# Patient Record
Sex: Female | Born: 1980 | Race: White | Hispanic: No
Health system: Southern US, Academic
[De-identification: ages and names within clinical notes are randomized; demographics above are authoritative.]

## PROBLEM LIST (undated history)

## (undated) ENCOUNTER — Encounter

## (undated) ENCOUNTER — Ambulatory Visit

## (undated) ENCOUNTER — Encounter: Attending: Infectious Disease | Primary: Infectious Disease

## (undated) ENCOUNTER — Encounter
Attending: Student in an Organized Health Care Education/Training Program | Primary: Student in an Organized Health Care Education/Training Program

## (undated) ENCOUNTER — Telehealth

## (undated) ENCOUNTER — Ambulatory Visit
Payer: BLUE CROSS/BLUE SHIELD | Attending: Student in an Organized Health Care Education/Training Program | Primary: Student in an Organized Health Care Education/Training Program

## (undated) ENCOUNTER — Ambulatory Visit: Payer: BLUE CROSS/BLUE SHIELD

## (undated) ENCOUNTER — Ambulatory Visit: Payer: PRIVATE HEALTH INSURANCE

## (undated) ENCOUNTER — Ambulatory Visit
Payer: PRIVATE HEALTH INSURANCE | Attending: Student in an Organized Health Care Education/Training Program | Primary: Student in an Organized Health Care Education/Training Program

## (undated) DIAGNOSIS — L409 Psoriasis, unspecified: Secondary | ICD-10-CM

## (undated) HISTORY — DX: Psoriasis, unspecified: L40.9

---

## 2014-04-19 ENCOUNTER — Other Ambulatory Visit: Payer: Self-pay | Admitting: Otolaryngology

## 2014-04-19 DIAGNOSIS — G43009 Migraine without aura, not intractable, without status migrainosus: Secondary | ICD-10-CM

## 2014-04-29 ENCOUNTER — Ambulatory Visit
Admission: RE | Admit: 2014-04-29 | Discharge: 2014-04-29 | Disposition: A | Discharge status: Home / Self Care | Payer: Medicaid Other | Source: Ambulatory Visit | Attending: Otolaryngology | Admitting: Otolaryngology

## 2014-04-29 DIAGNOSIS — G43009 Migraine without aura, not intractable, without status migrainosus: Secondary | ICD-10-CM

## 2014-04-29 MED ORDER — GADOBENATE DIMEGLUMINE 529 MG/ML IV SOLN
12.0000 mL | Freq: Once | INTRAVENOUS | Status: AC | PRN
  Administered 2014-04-29: 12 mL via INTRAVENOUS

## 2016-06-02 IMAGING — MR MR HEAD WO/W CM
10 of 11 series · 30 of 48 positions shown · IV contrast (multihance)
Comparison: None.

CLINICAL DATA: Acute onset of left-sided pressure and pain in the
left ear region.

EXAM:
MRI HEAD WITHOUT AND WITH CONTRAST
TECHNIQUE: Multiplanar, multiecho pulse sequences of the brain and surrounding
structures were obtained without and with intravenous contrast.
CONTRAST:  12mL MULTIHANCE GADOBENATE DIMEGLUMINE 529 MG/ML IV SOLN

[Series 2: T1 · sagittal · 5.0mm · 0.45mm/px · 2 of 21 slices shown (1 of 4)]
[im 1/21]
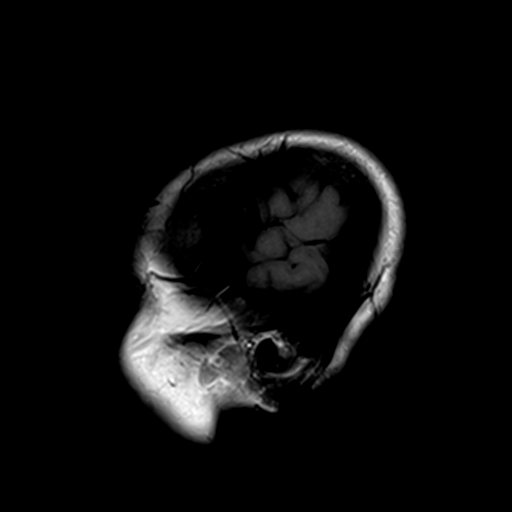
[im 21/21]
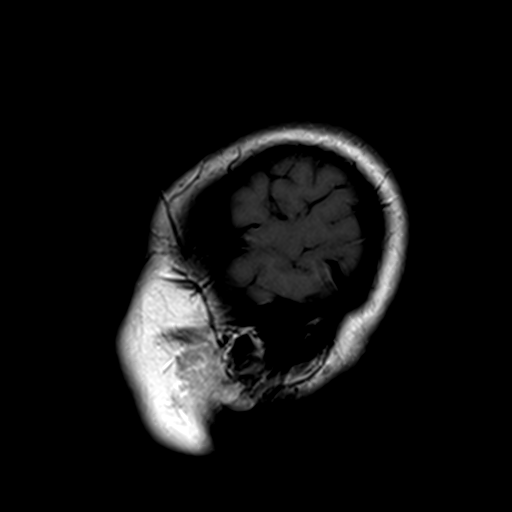

[Series 3: ep2d_diff_(id)_trace · axial · 5.0mm · 1.80mm/px · z∈[-35,+103]mm · 7 of 46 slices shown]
[im 1/46]
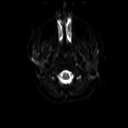
[im 8/46]
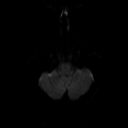
[im 16/46]
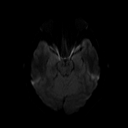
[im 23/46]
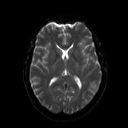
[im 31/46]
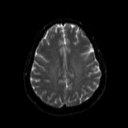
[im 38/46]
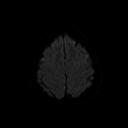
[im 46/46]
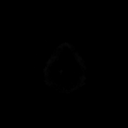

[Series 4: ep2d_diff_(id)_trace_adc · axial · 5.0mm · 1.80mm/px · z∈[-35,+103]mm · 4 of 23 slices shown]
[im 1/23]
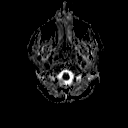
[im 8/23]
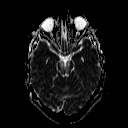
[im 15/23]
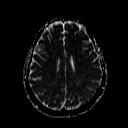
[im 23/23]
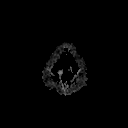

[Series 5: T2 · axial · 5.0mm · 0.45mm/px · z∈[-31,+100]mm · 4 of 22 slices shown]
[im 1/22]
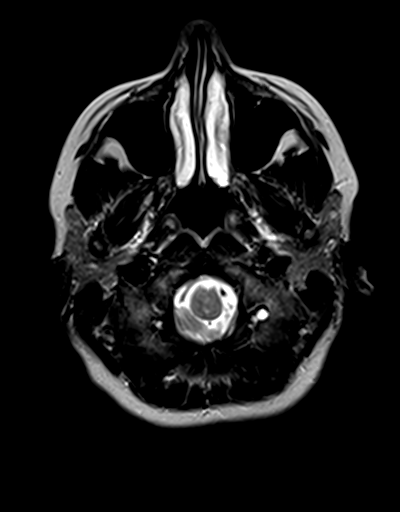
[im 8/22]
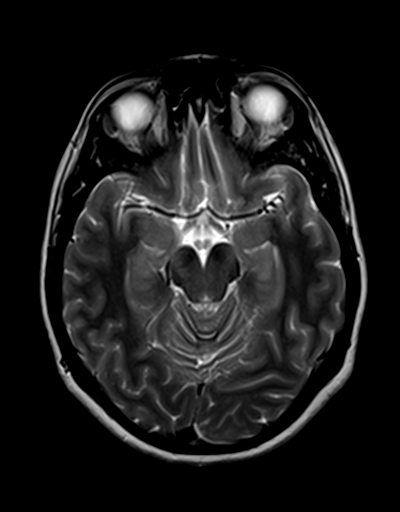
[im 15/22]
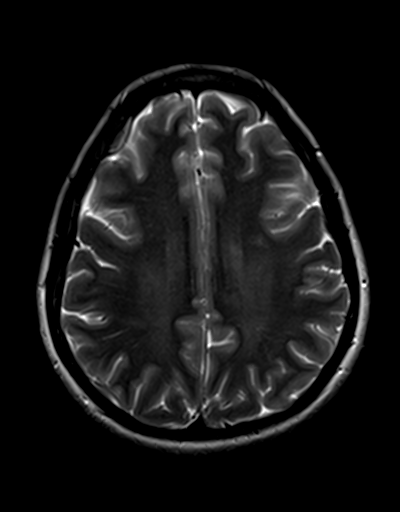
[im 22/22]
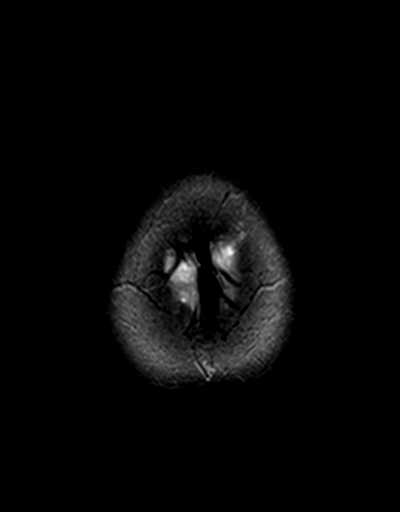

[Series 6: FLAIR · axial · 5.0mm · 0.45mm/px · z∈[-32,+99]mm · 4 of 22 slices shown]
[im 1/22]
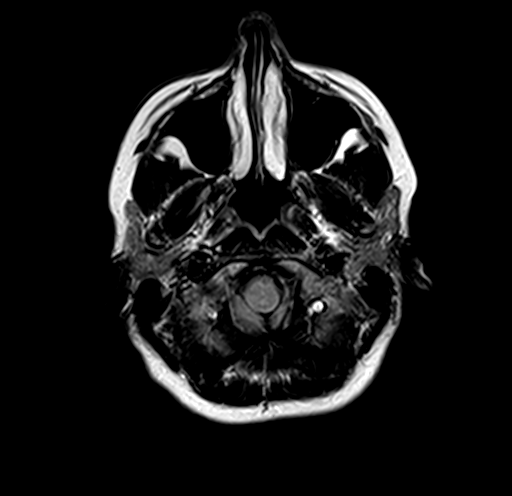
[im 8/22]
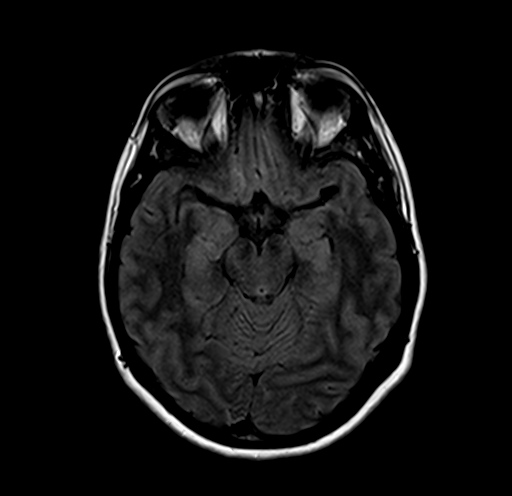
[im 15/22]
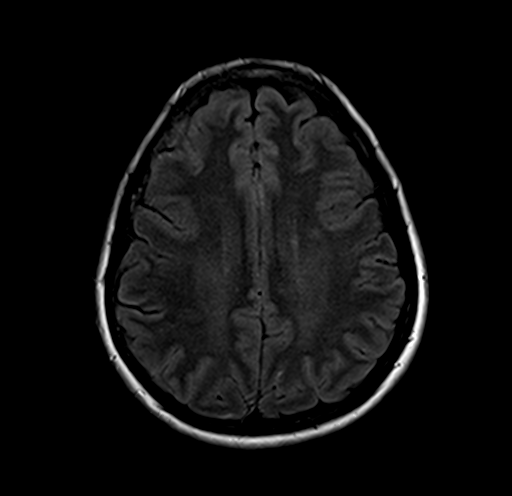
[im 22/22]
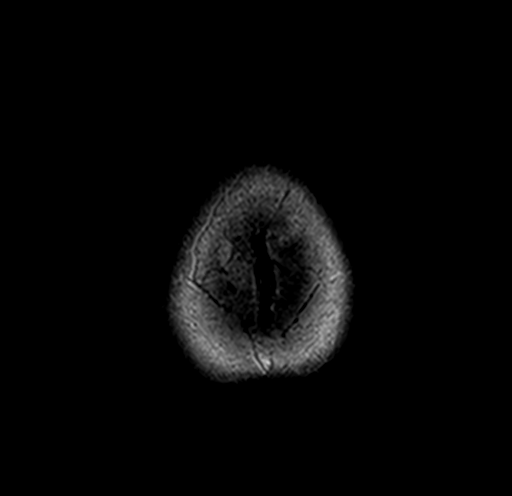

[Series 7: T1 · coronal · 3.0mm · 0.35mm/px · 2 of 11 slices shown (2 of 4)]
[im 1/11]
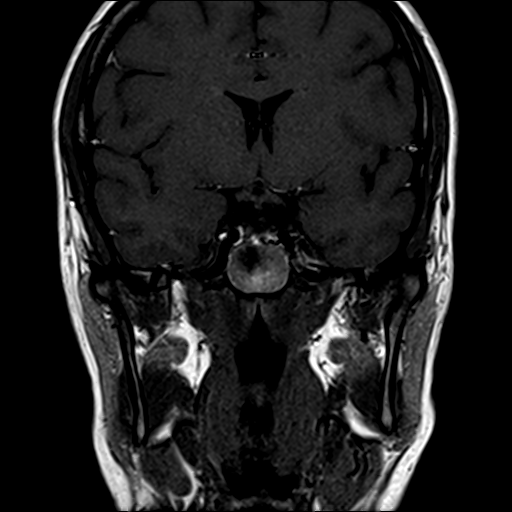
[im 11/11]
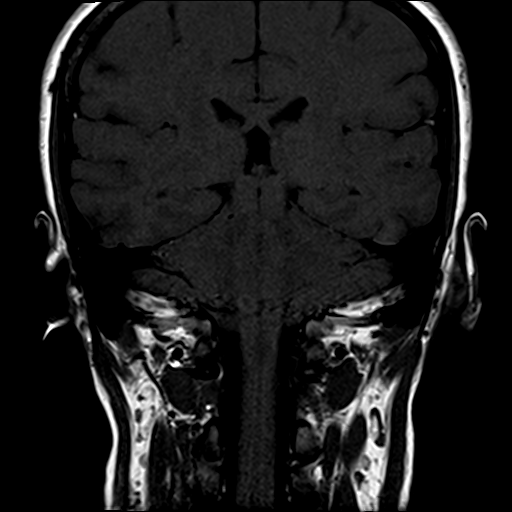

[Series 8: T1 · axial · 3.0mm · 0.35mm/px · z∈[-35,-4]mm · 2 of 11 slices shown (3 of 4)]
[im 1/11]
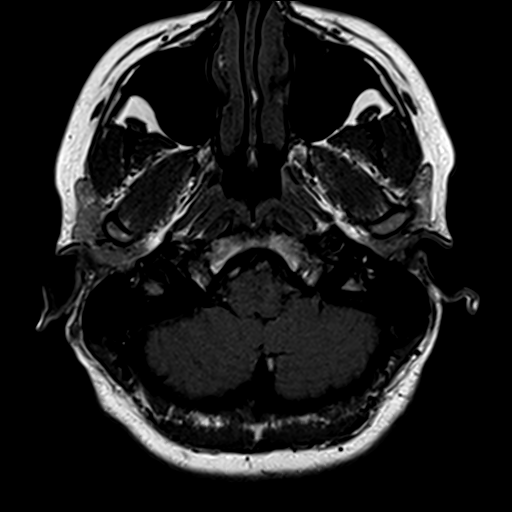
[im 11/11]
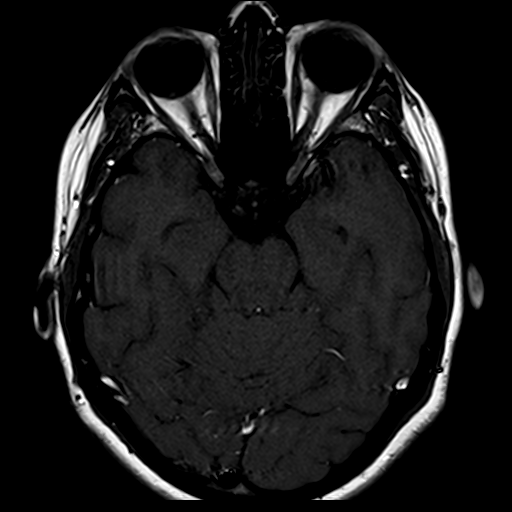

[Series 9: bSSFP · axial · 0.7mm · 0.28mm/px · 1 of 44 slices shown]
[im 1/44]
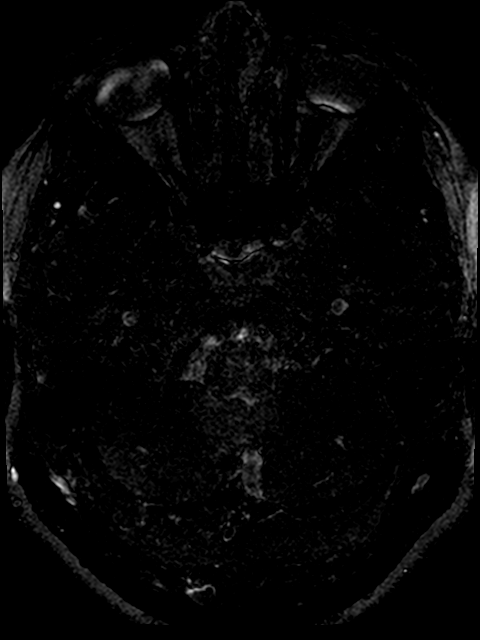

[Series 11: T1 · coronal · 3.0mm · 0.35mm/px · 2 of 11 slices shown (4 of 4)]
[im 1/11]
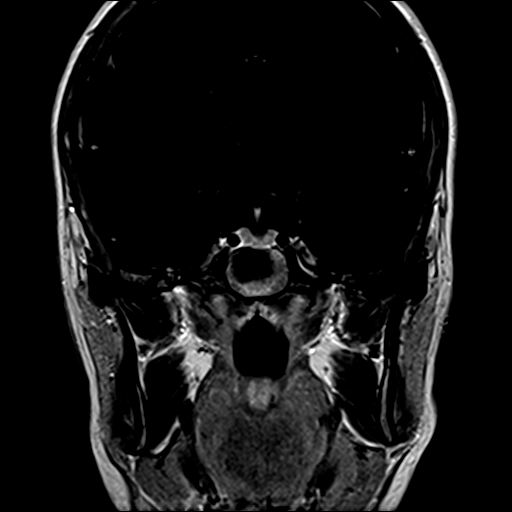
[im 11/11]
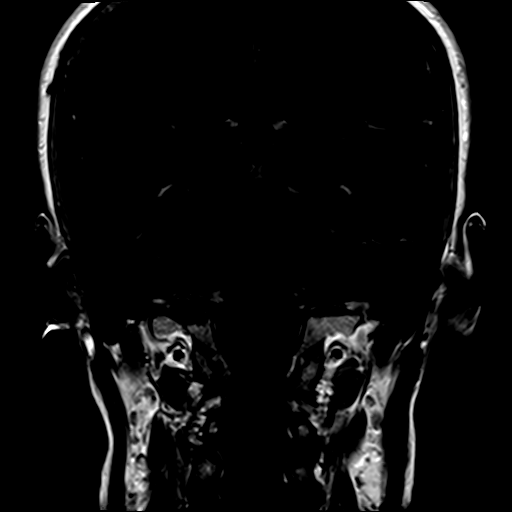

[Series 12: T1 post-contrast · axial · 3.0mm · 0.35mm/px · z∈[-35,-4]mm · 2 of 11 slices shown]
[im 1/11]
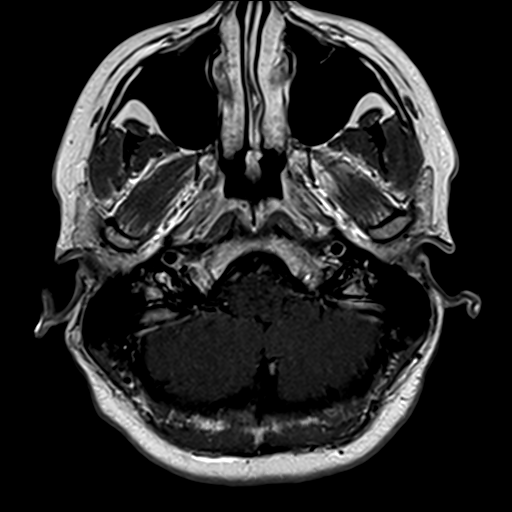
[im 11/11]
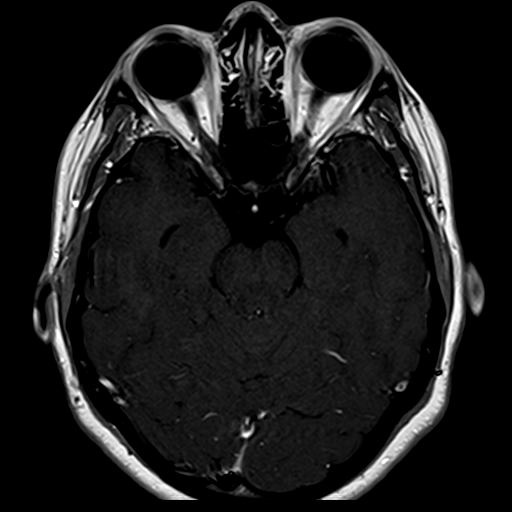

[30 of 48 positions shown; findings below may reference images not displayed]

FINDINGS: Diffusion imaging does not show any acute or subacute infarction.
The brainstem and cerebellum are normal. CP angle regions are
normal. No seventh or eighth nerve lesion. No vestibular schwannoma.
No fluid in the sinuses, middle ears or mastoids.

The cerebral hemispheres are normal except for a single focus of T2
and FLAIR signal in the white matter adjacent to the atrium of the
left lateral ventricle. As an isolated finding, this is not likely
of any significance.

No mass lesion, hemorrhage, hydrocephalus or extra-axial collection.
After contrast administration, no abnormal enhancement occurs.
IMPRESSION: No cause of the presenting symptoms is identified. No evidence of
vestibular schwannoma or any other pathology in the region of the
left temporal bone.

Single tiny focus of white matter signal adjacent to the atrium of
the left lateral ventricle, probably not of any clinical
significance.

## 2018-05-10 ENCOUNTER — Encounter: Payer: Self-pay | Admitting: Allergy and Immunology

## 2018-05-10 ENCOUNTER — Ambulatory Visit: Payer: BLUE CROSS/BLUE SHIELD | Admitting: Allergy and Immunology

## 2018-05-10 VITALS — BP 100/70 | HR 85 | Resp 16 | Ht 63.0 in | Wt 152.0 lb

## 2018-05-10 DIAGNOSIS — L2089 Other atopic dermatitis: Secondary | ICD-10-CM | POA: Diagnosis not present

## 2018-05-10 DIAGNOSIS — L209 Atopic dermatitis, unspecified: Secondary | ICD-10-CM | POA: Insufficient documentation

## 2018-05-10 DIAGNOSIS — T7840XD Allergy, unspecified, subsequent encounter: Secondary | ICD-10-CM

## 2018-05-10 DIAGNOSIS — J3089 Other allergic rhinitis: Secondary | ICD-10-CM | POA: Diagnosis not present

## 2018-05-10 DIAGNOSIS — L500 Allergic urticaria: Secondary | ICD-10-CM | POA: Diagnosis not present

## 2018-05-10 DIAGNOSIS — T7840XA Allergy, unspecified, initial encounter: Secondary | ICD-10-CM | POA: Insufficient documentation

## 2018-05-10 DIAGNOSIS — K2970 Gastritis, unspecified, without bleeding: Secondary | ICD-10-CM

## 2018-05-10 MED ORDER — MONTELUKAST SODIUM 10 MG PO TABS: 10.0000 mg | ORAL_TABLET | Freq: Every day | ORAL | 5 refills | Status: AC

## 2018-05-10 MED ORDER — LEVOCETIRIZINE DIHYDROCHLORIDE 5 MG PO TABS: 5.0000 mg | ORAL_TABLET | Freq: Every evening | ORAL | 5 refills | Status: AC

## 2018-05-10 MED ORDER — FAMOTIDINE 20 MG PO TABS: 20.0000 mg | ORAL_TABLET | Freq: Two times a day (BID) | ORAL | 5 refills | Status: AC

## 2018-05-10 NOTE — Assessment & Plan Note (Signed)
Unclear etiology. Skin tests to select food allergens were negative today. NSAIDs and emotional stress commonly exacerbate urticaria but are not the underlying etiology in this case.  History and lesions are not consistent with urticaria pigmentosa so I am not suspicious for mastocytosis. There are no concomitant symptoms concerning for anaphylaxis . We will rule out other potential etiologies with labs. For symptom relief, patient is to take oral antihistamines as directed.  The following labs have been ordered: FCeRI antibody, anti-thyroglobulin antibody, thyroid peroxidase antibody, tryptase, urea breath test, CBC, CMP, ESR, ANA, and galactose-alpha-1,3-galactose IgE level.  The patient will be called with further recommendations after lab results have returned.  Instructions have been discussed and provided for H1/H2 receptor blockade with titration to find lowest effective dose.  A prescription has been provided for levocetirizine, 5 mg daily as needed.  A prescription has been provided for famotidine (Pepcid) 20 mg twice daily as needed.  A prescription has been provided for montelukast 10 mg daily at bedtime.  Should there be a significant increase or change in symptoms, a journal is to be kept recording any foods eaten, beverages consumed, medications taken within a 6 hour period prior to the onset of symptoms, as well as record activities being performed, and environmental conditions. For any symptoms concerning for anaphylaxis, 911 is to be called immediately.

## 2018-05-10 NOTE — Assessment & Plan Note (Signed)
   Aeroallergen avoidance measures have been discussed and provided in written form.  Levocetirizine and montelukast have been prescribed (as above).  Patient if additional relief is required despite treatment plan as outlined above, a medicated nasal spray will be prescribed.

## 2018-05-10 NOTE — Assessment & Plan Note (Signed)
   Continue appropriate skin care measures and clobetasol as prescribed.  If this problem persists or progresses, consider dupilumab (Dupixent).

## 2018-05-10 NOTE — Patient Instructions (Addendum)
Recurrent urticaria Unclear etiology. Skin tests to select food allergens were negative today. NSAIDs and emotional stress commonly exacerbate urticaria but are not the underlying etiology in this case.  History and lesions are not consistent with urticaria pigmentosa so I am not suspicious for mastocytosis. There are no concomitant symptoms concerning for anaphylaxis . We will rule out other potential etiologies with labs. For symptom relief, patient is to take oral antihistamines as directed.  The following labs have been ordered: FCeRI antibody, anti-thyroglobulin antibody, thyroid peroxidase antibody, tryptase, urea breath test, CBC, CMP, ESR, ANA, and galactose-alpha-1,3-galactose IgE level.  The patient will be called with further recommendations after lab results have returned.  Instructions have been discussed and provided for H1/H2 receptor blockade with titration to find lowest effective dose.  A prescription has been provided for levocetirizine, 5 mg daily as needed.  A prescription has been provided for famotidine (Pepcid) 20 mg twice daily as needed.  A prescription has been provided for montelukast 10 mg daily at bedtime.  Should there be a significant increase or change in symptoms, a journal is to be kept recording any foods eaten, beverages consumed, medications taken within a 6 hour period prior to the onset of symptoms, as well as record activities being performed, and environmental conditions. For any symptoms concerning for anaphylaxis, 911 is to be called immediately.  Allergic rhinitis  Aeroallergen avoidance measures have been discussed and provided in written form.  Levocetirizine and montelukast have been prescribed (as above).  Patient if additional relief is required despite treatment plan as outlined above, a medicated nasal spray will be prescribed.  Atopic dermatitis  Continue appropriate skin care measures and clobetasol as prescribed.  If this problem  persists or progresses, consider dupilumab (Dupixent).   When lab results have returned the patient will be called with further recommendations and follow up instructions.  Urticaria (Hives)  . Levocetirizine (Xyzal) 5 mg twice a day and famotidine (Pepcid) 20 mg twice a day. If no symptoms for 7-14 days then decrease to. . Levocetirizine (Xyzal) 5 mg twice a day and famotidine (Pepcid) 20 mg once a day.  If no symptoms for 7-14 days then decrease to. . Levocetirizine (Xyzal) 5 mg twice a day.  If no symptoms for 7-14 days then decrease to. . Levocetirizine (Xyzal) 5 mg once a day.  May use Benadryl (diphenhydramine) as needed for breakthrough symptoms       If symptoms return, then step up dosage  Reducing Pollen Exposure  The American Academy of Allergy, Asthma and Immunology suggests the following steps to reduce your exposure to pollen during allergy seasons.    1. Do not hang sheets or clothing out to dry; pollen may collect on these items. 2. Do not mow lawns or spend time around freshly cut grass; mowing stirs up pollen. 3. Keep windows closed at night.  Keep car windows closed while driving. 4. Minimize morning activities outdoors, a time when pollen counts are usually at their highest. 5. Stay indoors as much as possible when pollen counts or humidity is high and on windy days when pollen tends to remain in the air longer. 6. Use air conditioning when possible.  Many air conditioners have filters that trap the pollen spores. 7. Use a HEPA room air filter to remove pollen form the indoor air you breathe.

## 2018-05-10 NOTE — Progress Notes (Signed)
New Patient Note  RE: Joann Hatfield MRN: [id number] DOB: [birth year] Date of Office Visit: 05/10/2018  Referring provider: No ref. provider found Primary care provider: Patient, No Pcp Per  Chief Complaint: Urticaria and Eczema   History of present illness: Joann Hatfield is a 37 y.o. female presenting today for evaluation of hives. Over the past 3 years, [name] has experienced recurrent episodes of hives.  These episodes occur "randomly."  The hives have appeared at different times over her entire body.  The lesions are described as erythematous, raised, and pruritic.  Individual hives last less than 24 hours without leaving residual pigmentation or bruising. She denies concomitant angioedema, cardiopulmonary symptoms and GI symptoms. She has not experienced unexpected weight loss, recurrent fevers or drenching night sweats. No specific medication, food, skin care product, detergent, soap, or other environmental triggers have been identified. The symptoms do not seem to correlate with NSAIDs use or emotional stress. She did not have symptoms consistent with a respiratory tract infection at the time of symptom onset. [name] has tried to control symptoms with topical steroid creams which have offered moderate temporary relief. She has not been evaluated and treated in the emergency department for these symptoms. Skin biopsy has not been performed. Recently, she has been experiencing hives daily. When she was a child she had eczema, however this problem resolved until approximately 3 years ago when it recurred on her feet and ankles.  She has had 2 biopsies with different dermatologist revealing eczema.  The eczema has been refractory despite treatment with topical corticosteroids, including clobetasol ointment which she is currently using.  Assessment and plan: Recurrent urticaria Unclear etiology. Skin tests to select food allergens were negative today. NSAIDs and emotional stress commonly  exacerbate urticaria but are not the underlying etiology in this case.  History and lesions are not consistent with urticaria pigmentosa so I am not suspicious for mastocytosis. There are no concomitant symptoms concerning for anaphylaxis . We will rule out other potential etiologies with labs. For symptom relief, patient is to take oral antihistamines as directed.  The following labs have been ordered: FCeRI antibody, anti-thyroglobulin antibody, thyroid peroxidase antibody, tryptase, urea breath test, CBC, CMP, ESR, ANA, and galactose-alpha-1,3-galactose IgE level.  The patient will be called with further recommendations after lab results have returned.  Instructions have been discussed and provided for H1/H2 receptor blockade with titration to find lowest effective dose.  A prescription has been provided for levocetirizine, 5 mg daily as needed.  A prescription has been provided for famotidine (Pepcid) 20 mg twice daily as needed.  A prescription has been provided for montelukast 10 mg daily at bedtime.  Should there be a significant increase or change in symptoms, a journal is to be kept recording any foods eaten, beverages consumed, medications taken within a 6 hour period prior to the onset of symptoms, as well as record activities being performed, and environmental conditions. For any symptoms concerning for anaphylaxis, 911 is to be called immediately.  Allergic rhinitis  Aeroallergen avoidance measures have been discussed and provided in written form.  Levocetirizine and montelukast have been prescribed (as above).  Patient if additional relief is required despite treatment plan as outlined above, a medicated nasal spray will be prescribed.  Atopic dermatitis  Continue appropriate skin care measures and clobetasol as prescribed.  If this problem persists or progresses, consider dupilumab (Dupixent).   Meds ordered this encounter  Medications  . levocetirizine (XYZAL) 5 MG  tablet    Sig:  Take 1 tablet (5 mg total) by mouth every evening.    Dispense:  30 tablet    Refill:  5  . montelukast (SINGULAIR) 10 MG tablet    Sig: Take 1 tablet (10 mg total) by mouth at bedtime.    Dispense:  30 tablet    Refill:  5  . famotidine (PEPCID) 20 MG tablet    Sig: Take 1 tablet (20 mg total) by mouth 2 (two) times daily.    Dispense:  30 tablet    Refill:  5    Diagnostics: Environmental skin testing: Positive to grass pollen. Food allergen skin testing: Negative despite a positive histamine control.    Physical examination: Blood pressure 100/70, pulse 85, resp. rate 16, height 5' 3"  (1.6 m), weight 152 lb (68.9 kg), SpO2 97 %.  General: Alert, interactive, in no acute distress. HEENT: TMs pearly gray, turbinates moderately edematous without discharge, post-pharynx mildly erythematous. Neck: Supple without lymphadenopathy. Lungs: Clear to auscultation without wheezing, rhonchi or rales. CV: Normal S1, S2 without murmurs. Abdomen: Nondistended, nontender. Skin: Dry, erythematous, excoriated patches on the ankles bilaterally. Extremities:  No clubbing, cyanosis or edema. Neuro:   Grossly intact.  Review of systems:  Review of systems negative except as noted in HPI / PMHx or noted below: Review of Systems  Constitutional: Negative.   HENT: Negative.   Eyes: Negative.   Respiratory: Negative.   Cardiovascular: Negative.   Gastrointestinal: Negative.   Genitourinary: Negative.   Musculoskeletal: Negative.   Skin: Negative.   Neurological: Negative.   Endo/Heme/Allergies: Negative.   Psychiatric/Behavioral: Negative.     Past medical history:  History reviewed. No pertinent past medical history.  Past surgical history:  History reviewed. No pertinent surgical history.  Family history: Family History  Problem Relation Age of Onset  . Asthma Mother   . Heart disease Father     Social history: Social History   Socioeconomic History  .  Marital status: Single    Spouse name: Not on file  . Number of children: Not on file  . Years of education: Not on file  . Highest education level: Not on file  Occupational History  . Not on file  Social Needs  . Financial resource strain: Not on file  . Food insecurity:    Worry: Not on file    Inability: Not on file  . Transportation needs:    Medical: Not on file    Non-medical: Not on file  Tobacco Use  . Smoking status: Former Research scientist (life sciences)  . Smokeless tobacco: Never Used  Substance and Sexual Activity  . Alcohol use: Never    Frequency: Never  . Drug use: Never  . Sexual activity: Not on file  Lifestyle  . Physical activity:    Days per week: Not on file    Minutes per session: Not on file  . Stress: Not on file  Relationships  . Social connections:    Talks on phone: Not on file    Gets together: Not on file    Attends religious service: Not on file    Active member of club or organization: Not on file    Attends meetings of clubs or organizations: Not on file    Relationship status: Not on file  . Intimate partner violence:    Fear of current or ex partner: Not on file    Emotionally abused: Not on file    Physically abused: Not on file    Forced sexual activity: Not  on file  Other Topics Concern  . Not on file  Social History Narrative  . Not on file   Environmental History: The patient lives in a 39 year old house with carpeting throughout and central air/heat.  There is a dog and a cat in the home which have access to her bedroom.  There is no known mold/water damage in the home.  She is a former cigarette smoker having quit in 2016 with a 20-pack-year history.  Allergies as of 05/10/2018      Reactions   Penicillins Other (See Comments)   Sulfa Antibiotics Other (See Comments)      Medication List        Accurate as of 05/10/18  8:31 PM. Always use your most recent med list.          clobetasol cream 0.05 % Commonly known as:  TEMOVATE Apply 1  application topically 2 (two) times daily.   famotidine 20 MG tablet Commonly known as:  PEPCID Take 1 tablet (20 mg total) by mouth 2 (two) times daily.   levocetirizine 5 MG tablet Commonly known as:  XYZAL Take 1 tablet (5 mg total) by mouth every evening.   montelukast 10 MG tablet Commonly known as:  SINGULAIR Take 1 tablet (10 mg total) by mouth at bedtime.   tacrolimus 0.1 % ointment Commonly known as:  PROTOPIC Apply topically 2 (two) times daily.   triamcinolone cream 0.1 % Commonly known as:  KENALOG Apply 1 application topically 2 (two) times daily.       Known medication allergies: Allergies  Allergen Reactions  . Penicillins Other (See Comments)  . Sulfa Antibiotics Other (See Comments)    I appreciate the opportunity to take part in Calayah's care. Please do not hesitate to contact me with questions.  Sincerely,   R. Edgar Frisk, MD

## 2018-05-14 LAB — TRYPTASE: Tryptase: 5.5 ug/L (ref 2.2–13.2)

## 2018-05-14 LAB — CBC WITH DIFFERENTIAL/PLATELET
Basophils Absolute: 0 10*3/uL (ref 0.0–0.2)
Basos: 0 %
EOS (ABSOLUTE): 0.2 10*3/uL (ref 0.0–0.4)
Eos: 3 %
Hematocrit: 39.7 % (ref 34.0–46.6)
Hemoglobin: 13.9 g/dL (ref 11.1–15.9)
Immature Grans (Abs): 0 10*3/uL (ref 0.0–0.1)
Immature Granulocytes: 0 %
Lymphocytes Absolute: 2.1 10*3/uL (ref 0.7–3.1)
Lymphs: 27 %
MCH: 31.7 pg (ref 26.6–33.0)
MCHC: 35 g/dL (ref 31.5–35.7)
MCV: 90 fL (ref 79–97)
Monocytes Absolute: 0.6 10*3/uL (ref 0.1–0.9)
Monocytes: 8 %
Neutrophils Absolute: 4.9 10*3/uL (ref 1.4–7.0)
Neutrophils: 62 %
Platelets: 296 10*3/uL (ref 150–450)
RBC: 4.39 x10E6/uL (ref 3.77–5.28)
RDW: 12.3 % (ref 12.3–15.4)
WBC: 7.8 10*3/uL (ref 3.4–10.8)

## 2018-05-14 LAB — CHRONIC URTICARIA: cu index: 2.4 (ref <10)

## 2018-05-14 LAB — ANA: Anti Nuclear Antibody(ANA): NEGATIVE

## 2018-05-14 LAB — THYROID PEROXIDASE ANTIBODY: Thyroperoxidase Ab SerPl-aCnc: 6 IU/mL (ref 0–34)

## 2018-05-14 LAB — THYROGLOBULIN ANTIBODY: Thyroglobulin Antibody: <1 IU/mL (ref 0.0–0.9)

## 2018-05-14 LAB — SEDIMENTATION RATE: Sed Rate: 5 mm/hr (ref 0–32)

## 2018-05-17 NOTE — Addendum Note (Signed)
Addended byClyda Greener: Lekisha Mcghee M on: 05/17/2018 05:01 PM   Modules accepted: Orders

## 2018-05-21 LAB — ALPHA-GAL PANEL
Alpha Gal IgE*: 0.54 kU/L — ABNORMAL HIGH (ref <0.10)
Beef (Bos spp) IgE: 0.27 kU/L (ref <0.35)
Class Interpretation: 0
Class Interpretation: 0
Lamb/Mutton (Ovis spp) IgE: <0.1 kU/L (ref <0.35)
Pork (Sus spp) IgE: <0.1 kU/L (ref <0.35)

## 2018-05-21 LAB — SPECIMEN STATUS REPORT

## 2018-05-27 ENCOUNTER — Telehealth: Payer: Self-pay

## 2018-05-27 MED ORDER — EPINEPHRINE 0.3 MG/0.3ML IJ SOAJ: 0.3000 mg | Freq: Once | INTRAMUSCULAR | 2 refills | Status: AC | PRN

## 2018-05-27 NOTE — Telephone Encounter (Signed)
Spoke with patient and reviewed lab results.  Per Dr. Nunzio CobbsBobbitt, have sent in an Epipen.

## 2018-07-08 NOTE — Addendum Note (Signed)
Addended by: Bennye Alm on: 07/08/2018 11:56 AM   Modules accepted: Orders

## 2018-07-10 LAB — H. PYLORI BREATH TEST: H pylori Breath Test: NEGATIVE

## 2018-07-12 ENCOUNTER — Telehealth: Payer: Self-pay

## 2018-07-12 NOTE — Telephone Encounter (Signed)
Dr Bobbitt please advise 

## 2018-07-12 NOTE — Telephone Encounter (Signed)
Called patient no answer see result note

## 2018-07-12 NOTE — Telephone Encounter (Signed)
Please inform the patient that the H. pylori lab results came in today and were negative.  Please send results to the patient's primary care physician/referring physician.  Thank you.

## 2018-07-12 NOTE — Telephone Encounter (Signed)
Patient is calling about her lab results for H. Pylori test. She states she called [facility] and they told her the results were in.  Please Advise.

## 2019-09-14 ENCOUNTER — Ambulatory Visit: Admit: 2019-09-14 | Discharge: 2019-09-15 | Payer: PRIVATE HEALTH INSURANCE

## 2019-09-14 DIAGNOSIS — L409 Psoriasis, unspecified: Principal | ICD-10-CM

## 2019-09-14 MED ORDER — IXEKIZUMAB 80 MG/ML SUBCUTANEOUS AUTO-INJECTOR
SUBCUTANEOUS | 4 refills | 0.00000 days | Status: CP
Start: 2019-09-14 — End: ?

## 2019-09-16 DIAGNOSIS — L409 Psoriasis, unspecified: Principal | ICD-10-CM

## 2019-09-28 DIAGNOSIS — L409 Psoriasis, unspecified: Principal | ICD-10-CM

## 2019-09-28 MED ORDER — IXEKIZUMAB 80 MG/ML SUBCUTANEOUS AUTO-INJECTOR
SUBCUTANEOUS | 4 refills | 0.00000 days | Status: CP
Start: 2019-09-28 — End: ?

## 2020-02-29 DIAGNOSIS — Z1231 Encounter for screening mammogram for malignant neoplasm of breast: Principal | ICD-10-CM

## 2020-03-15 DIAGNOSIS — U071 COVID-19: Principal | ICD-10-CM

## 2020-03-16 ENCOUNTER — Encounter: Admit: 2020-03-16 | Discharge: 2020-03-17 | Payer: PRIVATE HEALTH INSURANCE

## 2020-03-16 DIAGNOSIS — U071 COVID-19: Principal | ICD-10-CM

## 2020-03-22 DIAGNOSIS — L409 Psoriasis, unspecified: Principal | ICD-10-CM

## 2020-03-23 DIAGNOSIS — L409 Psoriasis, unspecified: Principal | ICD-10-CM

## 2020-03-23 MED ORDER — IXEKIZUMAB 80 MG/ML SUBCUTANEOUS AUTO-INJECTOR
SUBCUTANEOUS | 4 refills | 0.00000 days | Status: CP
Start: 2020-03-23 — End: ?

## 2020-04-09 ENCOUNTER — Encounter: Admit: 2020-04-09 | Discharge: 2020-04-09 | Payer: PRIVATE HEALTH INSURANCE

## 2020-05-28 ENCOUNTER — Encounter: Admit: 2020-05-28 | Discharge: 2020-05-29 | Payer: PRIVATE HEALTH INSURANCE

## 2020-05-28 DIAGNOSIS — L409 Psoriasis, unspecified: Principal | ICD-10-CM

## 2020-05-28 DIAGNOSIS — Z79899 Other long term (current) drug therapy: Principal | ICD-10-CM

## 2020-05-28 MED ORDER — IXEKIZUMAB 80 MG/ML SUBCUTANEOUS AUTO-INJECTOR
SUBCUTANEOUS | 4 refills | 0.00000 days | Status: CP
Start: 2020-05-28 — End: 2020-06-05

## 2020-05-29 DIAGNOSIS — L409 Psoriasis, unspecified: Principal | ICD-10-CM

## 2020-06-05 DIAGNOSIS — L409 Psoriasis, unspecified: Principal | ICD-10-CM

## 2020-06-05 MED ORDER — IXEKIZUMAB 80 MG/ML SUBCUTANEOUS AUTO-INJECTOR
SUBCUTANEOUS | 4 refills | 0.00000 days | Status: CP
Start: 2020-06-05 — End: 2020-08-01

## 2020-07-19 DIAGNOSIS — L409 Psoriasis, unspecified: Principal | ICD-10-CM

## 2020-07-19 MED ORDER — IXEKIZUMAB 80 MG/ML SUBCUTANEOUS SYRINGE
SUBCUTANEOUS | 0 refills | 0.00000 days | Status: CP
Start: 2020-07-19 — End: 2020-07-25

## 2020-07-25 DIAGNOSIS — L409 Psoriasis, unspecified: Principal | ICD-10-CM

## 2020-07-25 MED ORDER — IXEKIZUMAB 80 MG/ML SUBCUTANEOUS SYRINGE
SUBCUTANEOUS | 0 refills | 0.00000 days | Status: CP
Start: 2020-07-25 — End: ?

## 2020-07-31 DIAGNOSIS — L409 Psoriasis, unspecified: Principal | ICD-10-CM

## 2020-08-01 DIAGNOSIS — L409 Psoriasis, unspecified: Principal | ICD-10-CM

## 2020-08-01 MED ORDER — IXEKIZUMAB 80 MG/ML SUBCUTANEOUS AUTO-INJECTOR
SUBCUTANEOUS | 4 refills | 0.00000 days | Status: CP
Start: 2020-08-01 — End: ?

## 2021-01-21 DIAGNOSIS — L409 Psoriasis, unspecified: Principal | ICD-10-CM

## 2021-01-21 MED ORDER — IXEKIZUMAB 80 MG/ML SUBCUTANEOUS AUTO-INJECTOR
SUBCUTANEOUS | 4 refills | 0.00000 days | Status: CP
Start: 2021-01-21 — End: ?

## 2021-06-03 ENCOUNTER — Ambulatory Visit
Admit: 2021-06-03 | Discharge: 2021-06-04 | Payer: PRIVATE HEALTH INSURANCE | Attending: Student in an Organized Health Care Education/Training Program | Primary: Student in an Organized Health Care Education/Training Program

## 2021-06-03 DIAGNOSIS — L409 Psoriasis, unspecified: Principal | ICD-10-CM

## 2021-06-03 DIAGNOSIS — Z79899 Other long term (current) drug therapy: Principal | ICD-10-CM

## 2021-06-03 MED ORDER — CALCIPOTRIENE 0.005 % TOPICAL CREAM
2 refills | 0.00000 days | Status: CP
Start: 2021-06-03 — End: ?

## 2021-06-03 MED ORDER — TRIAMCINOLONE ACETONIDE 0.1 % TOPICAL OINTMENT
Freq: Two times a day (BID) | TOPICAL | 2 refills | 0.00000 days | Status: CP
Start: 2021-06-03 — End: 2022-06-03

## 2021-06-21 DIAGNOSIS — L409 Psoriasis, unspecified: Principal | ICD-10-CM

## 2021-06-21 MED ORDER — IXEKIZUMAB 80 MG/ML SUBCUTANEOUS AUTO-INJECTOR
SUBCUTANEOUS | 4 refills | 0.00000 days | Status: CP
Start: 2021-06-21 — End: ?
  Filled 2021-07-26: qty 1, 28d supply, fill #0

## 2021-06-24 DIAGNOSIS — L409 Psoriasis, unspecified: Principal | ICD-10-CM

## 2021-07-19 NOTE — Unmapped (Signed)
[facility] Specialty Medication Onboarding    Specialty Medication: Runner, broadcasting/film/video  Prior Authorization: Approved   Financial Assistance: No - copay  <$25  Final Copay/Day Supply: $4 / 28    Insurance Restrictions: None     Notes to Pharmacist: None    The triage team has completed the benefits investigation and has determined that the patient is able to fill this medication at [facility]. Please contact the patient to complete the onboarding or follow up with the prescribing physician as needed.

## 2021-07-23 NOTE — Unmapped (Addendum)
Update 2/10 - switch to syringes requested by patient required a new PA. This has been approved, but delayed shipment. I spoke with patient who confirmed delivery for Saturday via UPS to prescription address. Mas     Valeta is experiencing flaring since missing her dose in January (Was due ~ mid-month but insurance needed a new PA and had to appeal). She has been using triamcinolone with no/minimal effect. She reports her pharmacy does not have     [facility]   Patient Onboarding/Medication Counseling    Ms.Shippee is a 41 y.o. female with psoriasis who I am counseling today on continuation of therapy.  I am speaking to the patient.    Was a Nurse, learning disability used for this call? No    Verified patient's date of birth / HIPAA.    Specialty medication(s) to be sent: Inflammatory Disorders: Taltz      Non-specialty medications/supplies to be sent: na, declined sharps kit      Medications not needed at this time: na         Taltz (ixekizumab)    Medication & Administration     Dosage: Plaque psoriasis: Inject 160mg  under the skin at week 0, 80mg  at weeks 2, 4, 6, 8, 10, and 12, then 80mg  every 4 weeks (maintenance dosing only, has been on medication)    Lab tests required prior to treatment initiation:  ??? Tuberculosis: Tuberculosis screening resulted in a non-reactive Quantiferon TB Gold assay.    Administration:       Prefilled auto-injector pen  1. Gather all supplies needed for injection on a clean, flat working surface: medication pen removed from packaging, alcohol swab, sharps container, etc.  2. Look at the medication label - look for correct medication, correct dose, and check the expiration date  3. Look at the medication - the liquid visible in the window on the side of the pen device should appear clear and colorless to slight yellow  4. Lay the auto-injector pen on a flat surface and allow it to warm up to room temperature for at least 30 minutes  5. Select injection site - you can use the front of your thigh or your belly (but not the area 2 inches around your belly button); if someone else is giving you the injection you can also use your upper arm in the skin covering your triceps muscle  6. Prepare injection site - wash your hands and clean the skin at the injection site with an alcohol swab and let it air dry, do not touch the injection site again before the injection  7. Twist off the base safety cap in the direction of the arrows, do not remove until immediately prior to injection and do not touch the white needle cover  8. Place the clear base flat and firmly against your skin at the injection site at a 90 degree angle, hold the pen such that you can see the clear medication window  9. While holding the pen in place against the injection site, turn the lock ring to the unlock position; the pen is now ready to inject  10. Press the green injection button to initiate the injection, there will be a click when the injection starts  11. Continue to hold the pen firmly against your skin for about 10-15 seconds - you will see the gray plunger moving during the process; after 10-15 seconds you will hear a second click to let you know the injection is complete  12. To verify the injection is complete look and ensure the gray plunger is all the way at the bottom of the clear base; once completion is verified pull the pen away from your skin  13. Dispose of the used auto-injector pen immediately in your sharps disposal container the needle will be covered automatically  14. If you see any blood at the injection site, press a cotton ball or gauze on the site and maintain pressure until the bleeding stops, do not rub the injection site    Adherence/Missed dose instructions:  If your injection is given more than 7 days after your scheduled injection date - consult your pharmacist for additional instructions on how to adjust your dosing schedule.    Goals of Therapy       Plaque Psoriasis  ??? Minimize areas of skin involvement (% BSA)  ??? Avoidance of long term glucocorticoid use  ??? Maintenance of effective psychosocial functioning      Side Effects & Monitoring Parameters     ??? Injection site reaction (redness, irritation, inflammation localized to the site of administration)  ??? Signs of a common cold - minor sore throat, runny or stuffy nose, etc.  ??? Upset stomach    The following side effects should be reported to the provider:  ??? Signs of a hypersensitivity reaction - rash; hives; itching; red, swollen, blistered, or peeling skin; wheezing; tightness in the chest or throat; difficulty breathing, swallowing, or talking; swelling of the mouth, face, lips, tongue, or throat; etc.  ??? Reduced immune function - report signs of infection such as fever; chills; body aches; very bad sore throat; ear or sinus pain; cough; more sputum or change in color of sputum; pain with passing urine; wound that will not heal, etc.  Also at a slightly higher risk of some malignancies (mainly skin and blood cancers) due to this reduced immune function.  o In the case of signs of infection - the patient should hold the next dose of Taltz?? and call your primary care provider to ensure adequate medical care.  Treatment may be resumed when infection is treated and patient is asymptomatic.  ??? Stomach pain or diarrhea  ??? Redness or white patches in the mouth or throat      Contraindications, Warnings, & Precautions     ??? Have your bloodwork checked as you have been told by your prescriber  ??? New or worsened inflammatory bowel disease has happened with this drug  ??? Talk with your doctor if you are pregnant, planning to become pregnant, or breastfeeding  ??? Discuss the possible need for holding your dose(s) of Taltz when a planned procedure is scheduled with the prescriber as it may delay healing/recovery timeline       Drug/Food Interactions     ??? Medication list reviewed in Epic. The patient was instructed to inform the care team before taking any new medications or supplements. No drug interactions identified.   ??? Talk with you prescriber or pharmacist before receiving any live vaccinations while taking this medication and after you stop taking it    Storage, Handling Precautions, & Disposal     ??? Store this medication in the refrigerator.  Do not freeze  ??? If needed, you may store at room temperature for up to 5 days  ??? Store in original packaging, protected from light  ??? Do not shake  ??? Dispose of used syringes/pens in a Teacher, music  Current Medications (including OTC/herbals), Comorbidities and Allergies     Current Outpatient Medications   Medication Sig Dispense Refill   ??? calcipotriene (DOVONOX) 0.005 % cream Apply once daily on Saturday and Sunday on plaque psoriasis lesion 60 g 2   ??? clobetasol (TEMOVATE) 0.05 % ointment Apply 1-2 times a day to affected areas as needed until smooth, repeat if needed. Avoid on face and skin folds. 60 g 5   ??? ixekizumab (TALTZ SYRINGE) 80 mg/mL Syrg Inject 80 mg under the skin. Inject contents of syringe once every 2 weeks     ??? ixekizumab (TALTZ) 80 mg/mL AtIn auto-injector Inject the contents of 1 pen (80mg ) under the skin once every 4 weeks. 1 mL 4   ??? ixekizumab 80 mg/mL Syrg SubQ: 160 mg once, followed by 80 mg at weeks 2, 4, 6, 8, 10 and 12, and then 80 mg every 4 weeks. 8 mL 0   ??? levocetirizine (XYZAL) 5 MG tablet Take 1 tablet by mouth every evening.     ??? triamcinolone (KENALOG) 0.1 % ointment Apply topically Two (2) times a day. M-F use topical steroid for 2 weeks 80 g 2     No current facility-administered medications for this visit.       Allergies   Allergen Reactions   ??? Codeine      Other reaction(s): nausea and vomiting   ??? Penicillins Other (See Comments)     Other reaction(s): anaphylaxis   ??? Sulfa (Sulfonamide Antibiotics) Other (See Comments)     Other reaction(s): Unknown       Patient Active Problem List   Diagnosis   ??? Rosacea   ??? COVID-19       Reviewed and up to date in Epic.    Appropriateness of Therapy     Acute infections noted within Epic:  No active infections  Patient reported infection: None    Is medication and dose appropriate based on diagnosis and infection status? Yes    Prescription has been clinically reviewed: Yes      Baseline Quality of Life Assessment      How many days over the past month did your psoriasis  keep you from your normal activities? For example, brushing your teeth or getting up in the morning. 0    Financial Information     Medication Assistance provided: Prior Authorization    Anticipated copay of $4 reviewed with patient. Verified delivery address.    Delivery Information     Scheduled delivery date: Friday, 2/10    Expected start date: Friday, 2/10    Medication will be delivered via UPS to the prescription address in [facility].  This shipment will not require a signature.      Explained the services we provide at [facility] and that each month we would call to set up refills.  Stressed importance of returning phone calls so that we could ensure they receive their medications in time each month.  Informed patient that we should be setting up refills 7-10 days prior to when they will run out of medication.  A pharmacist will reach out to perform a clinical assessment periodically.  Informed patient that a welcome packet, containing information about our pharmacy and other support services, a Notice of Privacy Practices, and a drug information handout will be sent.      The patient or caregiver noted above participated in the development of this care plan and knows that they can request  review of or adjustments to the care plan at any time.      Patient or caregiver verbalized understanding of the above information as well as how to contact the pharmacy at [phone] option 4 with any questions/concerns.  The pharmacy is open Monday through Friday 8:30am-4:30pm.  A pharmacist is available 24/7 via pager to answer any clinical questions they may have.    Patient Specific Needs     - Does the patient have any physical, cognitive, or cultural barriers? No    - Does the patient have adequate living arrangements? (i.e. the ability to store and take their medication appropriately) Yes    - Did you identify any home environmental safety or security hazards? No    - Patient prefers to have medications discussed with  Patient     - Is the patient or caregiver able to read and understand education materials at a high school level or above? Yes    - Patient's primary language is  English     - Is the patient high risk? No    Lakendria Nicastro A Desiree Lucy Shared [facility] Specialty Pharmacist

## 2021-07-24 DIAGNOSIS — L409 Psoriasis, unspecified: Principal | ICD-10-CM

## 2021-07-25 MED ORDER — BETAMETHASONE VALERATE 0.1 % TOPICAL OINTMENT
Freq: Two times a day (BID) | TOPICAL | 2 refills | 0.00000 days | Status: CP
Start: 2021-07-25 — End: 2022-07-25
  Filled 2021-07-29: qty 45, 15d supply, fill #0

## 2021-07-25 MED ORDER — CALCIPOTRIENE 0.005 % TOPICAL CREAM
Freq: Two times a day (BID) | TOPICAL | 2 refills | 60.00000 days | Status: CP
Start: 2021-07-25 — End: 2022-07-25

## 2021-08-21 NOTE — Unmapped (Signed)
Alexis Rodgers still has quite a bit of psoriasis - it's less red, but hasn't cleared much yet. She reports the new betamethasone hasn't helped - I offered to see if a small supply of clobetasol could be allowed since she's been off ~ 2 months now.    [facility] Clinical Assessment & Refill Coordination Note    Alexis Rodgers, [place]: 1980/10/27  Phone: [phone] (home)     All above HIPAA information was verified with patient.     Was a Nurse, learning disability used for this call? No    Specialty Medication(s):   Inflammatory Disorders: Taltz     Current Outpatient Medications   Medication Sig Dispense Refill   ??? betamethasone valerate (VALISONE) 0.1 % ointment Apply topically Two (2) times a day. 45 g 2   ??? calcipotriene (DOVONOX) 0.005 % cream Apply once daily on Saturday and Sunday on plaque psoriasis lesion 60 g 2   ??? calcipotriene (DOVONOX) 0.005 % cream Apply topically Two (2) times a day. 60 g 2   ??? clobetasol (TEMOVATE) 0.05 % ointment Apply 1-2 times a day to affected areas as needed until smooth, repeat if needed. Avoid on face and skin folds. 60 g 5   ??? ixekizumab (TALTZ SYRINGE) 80 mg/mL Syrg Inject 80 mg under the skin. Inject contents of syringe once every 2 weeks     ??? ixekizumab 80 mg/mL Syrg Inject the contents of one syringe (80 mg) under the skin every 4 weeks. 1 mL 4   ??? ixekizumab 80 mg/mL Syrg SubQ: 160 mg once, followed by 80 mg at weeks 2, 4, 6, 8, 10 and 12, and then 80 mg every 4 weeks. 8 mL 0   ??? levocetirizine (XYZAL) 5 MG tablet Take 1 tablet by mouth every evening.     ??? triamcinolone (KENALOG) 0.1 % ointment Apply topically Two (2) times a day. M-F use topical steroid for 2 weeks 80 g 2     No current facility-administered medications for this visit.        Changes to medications: Alexis Rodgers reports no changes at this time.    Allergies   Allergen Reactions   ??? Codeine      Other reaction(s): nausea and vomiting   ??? Penicillins Other (See Comments)     Other reaction(s): anaphylaxis   ??? Sulfa (Sulfonamide Antibiotics) Other (See Comments)     Other reaction(s): Unknown       Changes to allergies: No    SPECIALTY MEDICATION ADHERENCE     Taltz - 0 left  Medication Adherence    Patient reported X missed doses in the last month: 0  Specialty Medication: Taltz          Specialty medication(s) dose(s) confirmed: Regimen is correct and unchanged.     Are there any concerns with adherence? No    Adherence counseling provided? Not needed    CLINICAL MANAGEMENT AND INTERVENTION      Clinical Benefit Assessment:    Do you feel the medicine is effective or helping your condition? Yes    Clinical Benefit counseling provided? Not needed    Adverse Effects Assessment:    Are you experiencing any side effects? Yes, patient reports experiencing some GI upset for a few days after dose - it's something she experienced in the beginning last time too. Side effect counseling provided: advised to monitor - if it doesn't improve or if it worsens, we should loop in clinic. warned of rare il-17  ibd risk    Are you experiencing difficulty administering your medicine? No    Quality of Life Assessment:    Quality of Life    Rheumatology  Oncology  Dermatology  1. What impact has your specialty medication had on the symptoms of your skin condition (i.e. itchiness, soreness, stinging)?: Some  2. What impact has your specialty medication had on your comfort level with your skin?: Some  Cystic Fibrosis          Have you discussed this with your provider? Not needed    Acute Infection Status:    Acute infections noted within Epic:  No active infections  Patient reported infection: None    Therapy Appropriateness:    Is therapy appropriate and patient progressing towards therapeutic goals? Yes, therapy is appropriate and should be continued    DISEASE/MEDICATION-SPECIFIC INFORMATION      For patients on injectable medications: Patient currently has 0 doses left.  Next injection is scheduled for Sat, 3/11.    PATIENT SPECIFIC NEEDS     - Does the patient have any physical, cognitive, or cultural barriers? No    - Is the patient high risk? No    - Does the patient require a Care Management Plan? No       SHIPPING     Specialty Medication(s) to be Shipped:   Inflammatory Disorders: Taltz    Other medication(s) to be shipped: No additional medications requested for fill at this time     Changes to insurance: No    Delivery Scheduled: Yes, Expected medication delivery date: Friday, 3/10.     Medication will be delivered via UPS to the confirmed prescription address in [facility].    The patient will receive a drug information handout for each medication shipped and additional FDA Medication Guides as required.  Verified that patient has previously received a Conservation officer, historic buildings and a Surveyor, mining.    The patient or caregiver noted above participated in the development of this care plan and knows that they can request review of or adjustments to the care plan at any time.      All of the patient's questions and concerns have been addressed.    Lanney Gins   [facility] Specialty Pharmacist

## 2021-08-22 MED ORDER — CLOBETASOL 0.05 % TOPICAL OINTMENT
Freq: Two times a day (BID) | TOPICAL | 1 refills | 0.00000 days | Status: CP
Start: 2021-08-22 — End: 2022-08-22
  Filled 2021-08-23: qty 60, 15d supply, fill #0

## 2021-08-22 MED FILL — TALTZ SYRINGE 80 MG/ML SUBCUTANEOUS: 28 days supply | Qty: 1 | Fill #1

## 2021-09-12 NOTE — Unmapped (Signed)
[facility] Refill Coordination Note    Specialty Medication(s) to be Shipped:   Inflammatory Disorders: Taltz    Other medication(s) to be shipped: No additional medications requested for fill at this time     Alexis Rodgers, DOB: March 14, 1981  Phone: [phone] (home)       All above HIPAA information was verified with patient.     Was a Nurse, learning disability used for this call? No    Completed refill call assessment today to schedule patient's medication shipment from the [facility] [phone]).  All relevant notes have been reviewed.     Specialty medication(s) and dose(s) confirmed: Regimen is correct and unchanged.   Changes to medications: Atarah reports no changes at this time.  Changes to insurance: No  New side effects reported not previously addressed with a pharmacist or physician: None reported  Questions for the pharmacist: No    Confirmed patient received a Conservation officer, historic buildings and a Surveyor, mining with first shipment. The patient will receive a drug information handout for each medication shipped and additional FDA Medication Guides as required.       DISEASE/MEDICATION-SPECIFIC INFORMATION        For patients on injectable medications: Patient currently has 0 doses left.  Next injection is scheduled for 04/09.    SPECIALTY MEDICATION ADHERENCE     Medication Adherence    Patient reported X missed doses in the last month: 0  Specialty Medication: taltz 80mg /ml  Patient is on additional specialty medications: No  Patient is on more than two specialty medications: No  Any gaps in refill history greater than 2 weeks in the last 3 months: no  Demonstrates understanding of importance of adherence: yes  Informant: patient  Reliability of informant: reliable  Provider-estimated medication adherence level: good  Patient is at risk for Non-Adherence: No  Reasons for non-adherence: no problems identified  Confirmed plan for next specialty medication refill: delivery by pharmacy  Refills needed for supportive medications: not needed          Refill Coordination    Has the Patients' Contact Information Changed: No  Is the Shipping Address Different: No         Were doses missed due to medication being on hold? No      taltz 80 mg/ml: 0 days of medicine on hand     REFERRAL TO PHARMACIST     Referral to the pharmacist: Not needed      [facility]     Shipping address confirmed in Epic.     Delivery Scheduled: Yes, Expected medication delivery date: 04/05.     Medication will be delivered via UPS to the prescription address in Epic WAM.    Antonietta Barcelona   [facility] Specialty Technician

## 2021-09-17 MED FILL — TALTZ SYRINGE 80 MG/ML SUBCUTANEOUS: 28 days supply | Qty: 1 | Fill #2

## 2021-10-14 NOTE — Unmapped (Signed)
The [facility] has made a second and final attempt to reach this patient to refill the following medication:Taltz.      We have left voicemail with patient said she would call back at the following phone numbers: [phone] and have sent a text message to the following phone numbers: [phone] .    Dates contacted: 4/27 and 5/1  Last scheduled delivery: 4/5    The patient may be at risk of non-compliance with this medication. The patient should call the [facility] at [phone]  Option 4, then Option 2 (all other specialty patients) to refill medication.    Ly Wass D Administrator Shared [facility] Specialty Technician

## 2021-10-17 NOTE — Unmapped (Signed)
[facility] Refill Coordination Note    Specialty Medication(s) to be Shipped:   Inflammatory Disorders: Taltz    Other medication(s) to be shipped: No additional medications requested for fill at this time     Yitta Gongaware, DOB: 1981/06/11  Phone: [phone] (home)       All above HIPAA information was verified with patient.     Was a Nurse, learning disability used for this call? No    Completed refill call assessment today to schedule patient's medication shipment from the [facility] [phone]).  All relevant notes have been reviewed.     Specialty medication(s) and dose(s) confirmed: Regimen is correct and unchanged.   Changes to medications: Brycelyn reports no changes at this time.  Changes to insurance: No  New side effects reported not previously addressed with a pharmacist or physician: None reported  Questions for the pharmacist: No    Confirmed patient received a Conservation officer, historic buildings and a Surveyor, mining with first shipment. The patient will receive a drug information handout for each medication shipped and additional FDA Medication Guides as required.       DISEASE/MEDICATION-SPECIFIC INFORMATION        For patients on injectable medications: Patient currently has 0 doses left.  Next injection is scheduled for 5/9.    SPECIALTY MEDICATION ADHERENCE     Medication Adherence    Patient reported X missed doses in the last month: 0  Specialty Medication: Programmer, applications understanding of importance of adherence: yes  Informant: patient  Reliability of informant: reliable  Confirmed plan for next specialty medication refill: delivery by pharmacy  Refills needed for supportive medications: not needed              Were doses missed due to medication being on hold? No        REFERRAL TO PHARMACIST     Referral to the pharmacist: Not needed      [facility]     Shipping address confirmed in Epic.     Delivery Scheduled: Yes, Expected medication delivery date: 5/9. Medication will be delivered via UPS to the prescription address in Epic WAM.    Westley Gambles   [facility] Specialty Technician

## 2021-10-21 MED FILL — TALTZ SYRINGE 80 MG/ML SUBCUTANEOUS: 28 days supply | Qty: 1 | Fill #3

## 2021-11-12 NOTE — Unmapped (Signed)
[facility] Refill Coordination Note    Specialty Medication(s) to be Shipped:   Inflammatory Disorders: Taltz    Other medication(s) to be shipped: No additional medications requested for fill at this time     Alexis Rodgers, DOB: [birth year]  Phone: [phone] (home)       All above HIPAA information was verified with patient.     Was a Nurse, learning disability used for this call? No    Completed refill call assessment today to schedule patient's medication shipment from the [facility] [phone]).  All relevant notes have been reviewed.     Specialty medication(s) and dose(s) confirmed: Regimen is correct and unchanged.   Changes to medications: Alexis Rodgers reports no changes at this time.  Changes to insurance: No  New side effects reported not previously addressed with a pharmacist or physician: None reported  Questions for the pharmacist: No    Confirmed patient received a Conservation officer, historic buildings and a Surveyor, mining with first shipment. The patient will receive a drug information handout for each medication shipped and additional FDA Medication Guides as required.       DISEASE/MEDICATION-SPECIFIC INFORMATION        For patients on injectable medications: Patient currently has 0 doses left.  Next injection is scheduled for 11/19/2021.    SPECIALTY MEDICATION ADHERENCE     Medication Adherence    Patient reported X missed doses in the last month: 0  Specialty Medication: Taltz  Patient is on additional specialty medications: No  Any gaps in refill history greater than 2 weeks in the last 3 months: no  Demonstrates understanding of importance of adherence: yes  Informant: patient  Reliability of informant: reliable  Confirmed plan for next specialty medication refill: delivery by pharmacy  Refills needed for supportive medications: not needed        Were doses missed due to medication being on hold? No     REFERRAL TO PHARMACIST     Referral to the pharmacist: Not needed      [facility]     Shipping address confirmed in Epic.     Delivery Scheduled: Yes, Expected medication delivery date: 11/15/2021.     Medication will be delivered via UPS to the prescription address in Epic WAM.    Lorelei Pont [facility] Specialty Technician

## 2021-11-14 MED FILL — TALTZ SYRINGE 80 MG/ML SUBCUTANEOUS: 28 days supply | Qty: 1 | Fill #4

## 2021-11-22 ENCOUNTER — Ambulatory Visit: Admit: 2021-11-22 | Discharge: 2021-11-23 | Payer: PRIVATE HEALTH INSURANCE

## 2021-11-22 DIAGNOSIS — K649 Unspecified hemorrhoids: Principal | ICD-10-CM

## 2021-11-22 DIAGNOSIS — Z8349 Family history of other endocrine, nutritional and metabolic diseases: Principal | ICD-10-CM

## 2021-11-22 DIAGNOSIS — Z7689 Persons encountering health services in other specified circumstances: Principal | ICD-10-CM

## 2021-11-22 DIAGNOSIS — Z1239 Encounter for other screening for malignant neoplasm of breast: Principal | ICD-10-CM

## 2021-11-22 DIAGNOSIS — F419 Anxiety disorder, unspecified: Principal | ICD-10-CM

## 2021-11-22 DIAGNOSIS — Z124 Encounter for screening for malignant neoplasm of cervix: Principal | ICD-10-CM

## 2021-11-22 DIAGNOSIS — Z01419 Encounter for gynecological examination (general) (routine) without abnormal findings: Principal | ICD-10-CM

## 2021-11-22 LAB — COMPREHENSIVE METABOLIC PANEL
ALBUMIN: 4.1 g/dL (ref 3.4–5.0)
ALKALINE PHOSPHATASE: 61 U/L (ref 46–116)
ALT (SGPT): 13 U/L (ref 10–49)
ANION GAP: 7 mmol/L (ref 5–14)
AST (SGOT): 15 U/L (ref <=34)
BILIRUBIN TOTAL: 0.6 mg/dL (ref 0.3–1.2)
BLOOD UREA NITROGEN: 10 mg/dL (ref 9–23)
BUN / CREAT RATIO: 16
CALCIUM: 9.2 mg/dL (ref 8.7–10.4)
CHLORIDE: 108 mmol/L — ABNORMAL HIGH (ref 98–107)
CO2: 26 mmol/L (ref 20.0–31.0)
CREATININE: 0.61 mg/dL
EGFR CKD-EPI (2021) FEMALE: >90 mL/min/{1.73_m2} (ref >=60)
GLUCOSE RANDOM: 85 mg/dL (ref 70–99)
POTASSIUM: 4.2 mmol/L (ref 3.4–4.8)
PROTEIN TOTAL: 7.3 g/dL (ref 5.7–8.2)
SODIUM: 141 mmol/L (ref 135–145)

## 2021-11-22 LAB — LIPID PANEL
CHOLESTEROL/HDL RATIO SCREEN: 2.3 (ref 1.0–4.5)
CHOLESTEROL: 137 mg/dL (ref <=200)
HDL CHOLESTEROL: 60 mg/dL (ref 40–60)
LDL CHOLESTEROL CALCULATED: 64 mg/dL (ref 40–99)
NON-HDL CHOLESTEROL: 77 mg/dL (ref 70–130)
TRIGLYCERIDES: 65 mg/dL (ref 0–150)
VLDL CHOLESTEROL CAL: 13 mg/dL (ref 9–37)

## 2021-11-22 LAB — CBC
HEMATOCRIT: 43 % (ref 34.0–44.0)
HEMOGLOBIN: 14.6 g/dL (ref 11.3–14.9)
MEAN CORPUSCULAR HEMOGLOBIN CONC: 34.1 g/dL (ref 32.0–36.0)
MEAN CORPUSCULAR HEMOGLOBIN: 31.2 pg (ref 25.9–32.4)
MEAN CORPUSCULAR VOLUME: 91.6 fL (ref 77.6–95.7)
MEAN PLATELET VOLUME: 7.6 fL (ref 6.8–10.7)
PLATELET COUNT: 302 10*9/L (ref 150–450)
RED BLOOD CELL COUNT: 4.69 10*12/L (ref 3.95–5.13)
RED CELL DISTRIBUTION WIDTH: 13 % (ref 12.2–15.2)
WBC ADJUSTED: 8.9 10*9/L (ref 3.6–11.2)

## 2021-11-22 LAB — TSH: THYROID STIMULATING HORMONE: 0.78 u[IU]/mL (ref 0.550–4.780)

## 2021-11-22 MED ORDER — BUSPIRONE 5 MG TABLET
ORAL_TABLET | Freq: Three times a day (TID) | ORAL | 0 refills | 30.00000 days | Status: CP
Start: 2021-11-22 — End: ?

## 2021-11-23 NOTE — Unmapped (Signed)
Pap w/ HPV completed  STI screening offered and declined  Contraception: none  Breast CA Screening: Mammogram screening ordered to have done at [facility]  GYN complaints: hemorrhoids-wants possible removal and anxiety  Preventative health/Labs: CBC, CMP, Lipids, TSH. Referral to [facility] to establish care

## 2021-11-23 NOTE — Unmapped (Signed)
Addended by: Mack Guise D on: 11/22/2021 08:49 PM     Modules accepted: Orders

## 2021-11-23 NOTE — Unmapped (Addendum)
Hx of anxiety  Reports was on Lexapro and wellbutrin in the past but had some undesired side effects. Hx of taking ativan when flying  Reports feeling anxious and on edge. Wants to restart medication for anxiety  Discussed SSRIs, and anti-anxiety medications such as Buspar  Patient desire to take something on a daily basis and would like to try Buspar  Discussed Buspar indication: for treating generalized anxiety disorder. Side effects reviewed: dizziness, drowsiness, nausea, headache  Rx'd Buspar 5 mg 3 times/day or PRN for anxiety

## 2021-11-23 NOTE — Unmapped (Addendum)
Provider: Rica Koyanagi, CNM  Division: [facility]    Outpatient Gynecology Note: New Annual Visit    Assessment/Plan:      Alexis Rodgers is a 41 y.o. female G2P2000 with normal well-woman gynecologic exam.     Problem List Items Addressed This Visit        Other    Anxiety     Hx of anxiety  Reports was on Lexapro and wellbutrin in the past but had some undesired side effects. Hx of taking ativan when flying  Reports feeling anxious and on edge. Wants to restart medication for anxiety  Discussed SSRIs, and anti-anxiety medications such as Buspar  Patient desire to take something on a daily basis and would like to try Buspar  Discussed Buspar indication: for treating generalized anxiety disorder. Side effects reviewed: dizziness, drowsiness, nausea, headache  Rx'd Buspar 5 mg 3 times/day or PRN for anxiety           Relevant Medications    busPIRone (BUSPAR) 5 MG tablet    Other Relevant Orders    TSH (Completed)    Well woman exam with routine gynecological exam - Primary     Pap w/ HPV completed  STI screening offered and declined  Contraception: none  Breast CA Screening: Mammogram screening ordered to have done at [facility]  GYN complaints: hemorrhoids-wants possible removal and anxiety  Preventative health/Labs: CBC, CMP, Lipids, TSH. Referral to [facility] Internal Medicine to establish care         Relevant Orders    CBC (Completed)    Lipid Panel (Completed)    Comprehensive Metabolic Panel (Completed)    Pap Smear    HPV DNA HI RISK   Other Visit Diagnoses     Cervical cancer screening        Relevant Orders    Pap Smear    HPV DNA HI RISK    Family history of thyroid disease        Relevant Orders    TSH (Completed)    Establishing care with new doctor, encounter for        Relevant Orders    Ambulatory referral to Internal Medicine    Hemorrhoids, unspecified hemorrhoid type        Relevant Orders    Ambulatory referral to Gastroenterology    Encounter for screening for malignant neoplasm of breast, unspecified screening modality        Relevant Orders    Mammo Digital Screening W Tomo Bilateral          > Pap smear: Pap with HPV co-testing done today. If normal repeat in 5 years.  > Wet prep: Not done  > Mammography: yearly beginning at age 45  > Labs: Pap smear, CBC, CMP, Lipids, TSH  > Reviewed health maintenance including  diet and nutrition, vitamins, supplements, and herbals, adequate intake of calcium and vitamin D, regular exercise, adequate sleep, safe sex and condom use, contraception, BSE, need for mammogram and pap smear screening/results.    Current Outpatient Medications   Medication Sig Dispense Refill   ??? betamethasone valerate (VALISONE) 0.1 % ointment Apply topically Two (2) times a day. 45 g 2   ??? busPIRone (BUSPAR) 5 MG tablet Take 1 tablet (5 mg total) by mouth Three (3) times a day. 90 tablet 0   ??? calcipotriene (DOVONOX) 0.005 % cream Apply once daily on Saturday and Sunday on plaque psoriasis lesion 60 g 2   ??? calcipotriene (DOVONOX) 0.005 % cream Apply  topically Two (2) times a day. 60 g 2   ??? clobetasol (TEMOVATE) 0.05 % ointment Apply 1-2 times a day to affected areas as needed until smooth, repeat if needed. Avoid on face and skin folds. 60 g 5   ??? clobetasoL (TEMOVATE) 0.05 % ointment Apply topically Two (2) times a day. 60 g 1   ??? ixekizumab (TALTZ SYRINGE) 80 mg/mL Syrg Inject 80 mg under the skin. Inject contents of syringe once every 2 weeks     ??? ixekizumab 80 mg/mL Syrg Inject the contents of one syringe (80 mg) under the skin every 4 weeks. 1 mL 4   ??? ixekizumab 80 mg/mL Syrg SubQ: 160 mg once, followed by 80 mg at weeks 2, 4, 6, 8, 10 and 12, and then 80 mg every 4 weeks. 8 mL 0   ??? levocetirizine (XYZAL) 5 MG tablet Take 1 tablet by mouth every evening.     ??? triamcinolone (KENALOG) 0.1 % ointment Apply topically Two (2) times a day. M-F use topical steroid for 2 weeks 80 g 2     No current facility-administered medications for this visit.       Return in about 1 year (around 11/23/2022) for Annual physical.       Subjective:      Alexis Rodgers is a 41 y.o. female G2P2000 who presents for annual exam. Patient's last menstrual period was 11/10/2021. Periods are regular every 28-30 days and last 3-4 days, moderate in flow. Dysmenorrhea: mild, occurring premenstrually. Cyclic symptoms include: none. No intermenstrual bleeding, spotting, or discharge. Patient reports she has some external hemorrhoids that flare up intermittently, denies constipation, and wants to consider removal of hemorrhoids. Patient also reports having some heightened anxiety and admits to having a history of anxiety treated with Lexapro and Wellbutrin and Ativan PRN. Patient reports she is interested in restarting on a medication to ease her anxiety. Patient denies any further complaints.     Health Maintenance  > Contraceptive methods: None.  > Regular self breast exam: yes  > History of abnormal mammogram: no  > Domestic Violence History: no  > Exercise: gardening and walking  > Dietary Supplements: Multivitamin  > Seat Belt Use: yes      Current Outpatient Medications   Medication Sig Dispense Refill   ??? betamethasone valerate (VALISONE) 0.1 % ointment Apply topically Two (2) times a day. 45 g 2   ??? busPIRone (BUSPAR) 5 MG tablet Take 1 tablet (5 mg total) by mouth Three (3) times a day. 90 tablet 0   ??? calcipotriene (DOVONOX) 0.005 % cream Apply once daily on Saturday and Sunday on plaque psoriasis lesion 60 g 2   ??? calcipotriene (DOVONOX) 0.005 % cream Apply topically Two (2) times a day. 60 g 2   ??? clobetasol (TEMOVATE) 0.05 % ointment Apply 1-2 times a day to affected areas as needed until smooth, repeat if needed. Avoid on face and skin folds. 60 g 5   ??? clobetasoL (TEMOVATE) 0.05 % ointment Apply topically Two (2) times a day. 60 g 1   ??? ixekizumab (TALTZ SYRINGE) 80 mg/mL Syrg Inject 80 mg under the skin. Inject contents of syringe once every 2 weeks     ??? ixekizumab 80 mg/mL Syrg Inject the contents of one syringe (80 mg) under the skin every 4 weeks. 1 mL 4   ??? ixekizumab 80 mg/mL Syrg SubQ: 160 mg once, followed by 80 mg at weeks 2, 4, 6, 8, 10 and 12,  and then 80 mg every 4 weeks. 8 mL 0   ??? levocetirizine (XYZAL) 5 MG tablet Take 1 tablet by mouth every evening.     ??? triamcinolone (KENALOG) 0.1 % ointment Apply topically Two (2) times a day. M-F use topical steroid for 2 weeks 80 g 2     No current facility-administered medications for this visit.     Allergies   Allergen Reactions   ??? Codeine      Other reaction(s): nausea and vomiting   ??? Penicillins Other (See Comments)     Other reaction(s): anaphylaxis   ??? Sulfa (Sulfonamide Antibiotics) Other (See Comments)     Other reaction(s): Unknown       Past Medical History:   Diagnosis Date   ??? Kidney stone      Past Surgical History:   Procedure Laterality Date   ??? SKIN BIOPSY       OB History     Gravida   2    Para   2    Term   2    Preterm        AB        Living           SAB        IAB        Ectopic        Molar        Multiple        Live Births                  Social History     Tobacco Use   ??? Smoking status: Former     Types: Cigarettes     Quit date: 01/15/2015     Years since quitting: 6.8   ??? Smokeless tobacco: Never   Substance Use Topics   ??? Alcohol use: Yes     Alcohol/week: 0.0 standard drinks     Comment: occ     Social History     Substance and Sexual Activity   Sexual Activity Not Currently         There is no immunization history on file for this patient.      Review Of Systems   A comprehensive review of 14 systems was negative except for pertinent positives noted in HPI.       Objective:      BP 131/89  - Pulse 91  - Wt 65.8 kg (145 lb 1.6 oz)  - LMP 11/10/2021  - BMI 25.70 kg/m??     Constitutional: Well-developed, well-nourished female in no acute distress  Neurological: Alert and oriented to person, place, and time  Psychiatric: Mood and affect appropriate  Skin: No rashes or lesions  Neck: Supple without masses. Trachea is midline.Thyroid is normal size without masses  Lymphatics: No cervical, axillary, supraclavicular, or inguinal adenopathy noted  Respiratory: Clear to auscultation bilaterally. Good air movement with normal work of breathing.  Cardiovascular: Regular rate and rhythm. Extremities grossly normal, nontender with no edema; pulses regular  Gastrointestinal: Soft, nontender, nondistended. No masses or hernias appreciated. No hepatosplenomegaly. No fluid wave. No rebound or guarding.  Breast Exam: normal appearance, no masses or tenderness, No nipple retraction or dimpling, No nipple discharge or bleeding, No axillary or supraclavicular adenopathy  Genitourinary:          External Genitalia: Normal female genitalia     Urethra: Midline, no masses     Bladder: Well-suspended, NT  Vagina: smooth, no lesions.     Cervix: No lesions, normal size and consistency; no cervical motion tenderness, pap smear obtained      Uterus: Normal size and contour; smooth, mobile, NT, anteverted.  Adnexae: Non-palpable and non-tender  Perineum/Anus: No lesions  Rectal: small multiple external hemorrhoids noted       The sensitive parts of the examination were performed with Youseline, CMA as a chaperone

## 2021-12-05 DIAGNOSIS — K649 Unspecified hemorrhoids: Principal | ICD-10-CM

## 2021-12-05 DIAGNOSIS — Z30011 Encounter for initial prescription of contraceptive pills: Principal | ICD-10-CM

## 2021-12-05 DIAGNOSIS — L409 Psoriasis, unspecified: Principal | ICD-10-CM

## 2021-12-05 DIAGNOSIS — K5900 Constipation, unspecified: Principal | ICD-10-CM

## 2021-12-05 MED ORDER — NORETHINDRONE (CONTRACEPTIVE) 0.35 MG TABLET
ORAL_TABLET | Freq: Every day | ORAL | 3 refills | 84.00000 days | Status: CP
Start: 2021-12-05 — End: 2022-12-05

## 2021-12-05 MED ORDER — TALTZ SYRINGE 80 MG/ML SUBCUTANEOUS
SUBCUTANEOUS | 4 refills | 0 days | Status: CP
Start: 2021-12-05 — End: ?
  Filled 2021-12-10: qty 1, 28d supply, fill #0

## 2021-12-05 NOTE — Unmapped (Signed)
[facility] Refill Coordination Note    Specialty Medication(s) to be Shipped:   Inflammatory Disorders: Taltz    Other medication(s) to be shipped: No additional medications requested for fill at this time     Alexis Rodgers, DOB: [birth year]  Phone: [phone] (home)       All above HIPAA information was verified with patient.     Was a Nurse, learning disability used for this call? No    Completed refill call assessment today to schedule patient's medication shipment from the [facility] [phone]).  All relevant notes have been reviewed.     Specialty medication(s) and dose(s) confirmed: Regimen is correct and unchanged.   Changes to medications: Gargi reports no changes at this time.  Changes to insurance: No  New side effects reported not previously addressed with a pharmacist or physician: None reported  Questions for the pharmacist: No    Confirmed patient received a Conservation officer, historic buildings and a Surveyor, mining with first shipment. The patient will receive a drug information handout for each medication shipped and additional FDA Medication Guides as required.       DISEASE/MEDICATION-SPECIFIC INFORMATION        For patients on injectable medications: Patient currently has 0 doses left.  Next injection is scheduled for 12/17/2021.    SPECIALTY MEDICATION ADHERENCE     Medication Adherence    Patient reported X missed doses in the last month: 0  Specialty Medication: Taltz  Patient is on additional specialty medications: No  Any gaps in refill history greater than 2 weeks in the last 3 months: no  Demonstrates understanding of importance of adherence: yes  Informant: patient  Reliability of informant: reliable  Confirmed plan for next specialty medication refill: delivery by pharmacy  Refills needed for supportive medications: not needed        Were doses missed due to medication being on hold? No     REFERRAL TO PHARMACIST     Referral to the pharmacist: Not needed      [facility]     Shipping address confirmed in Epic.     Delivery Scheduled: Yes, Expected medication delivery date: 12/11/2021.  However, Rx request for refills was sent to the provider as there are none remaining.     Medication will be delivered via UPS to the prescription address in Epic WAM.    Ryleigh Buenger D Tyneisha Hegeman   [facility] Specialty Technician

## 2021-12-09 ENCOUNTER — Ambulatory Visit: Admit: 2021-12-09 | Discharge: 2021-12-10 | Payer: PRIVATE HEALTH INSURANCE

## 2021-12-09 DIAGNOSIS — K649 Unspecified hemorrhoids: Principal | ICD-10-CM

## 2021-12-09 DIAGNOSIS — K5900 Constipation, unspecified: Principal | ICD-10-CM

## 2021-12-09 DIAGNOSIS — K644 Residual hemorrhoidal skin tags: Principal | ICD-10-CM

## 2021-12-09 NOTE — Unmapped (Signed)
[facility]  Outpatient Clinic Note     Patient Name: Alexis Rodgers   MRN #: [id number]   Date of Service: December 09, 2021    Admitting Physician: Claretta Fraise, MD     Chief Complaint: Perianal irritation       Patient Name: Alexis Rodgers  Patient MRN: [id number]  Date of Service: 12/09/2021  Attending: Claretta Fraise, MD    Reason for Visit   Irritation around the anus with associated mass' of tissue.   Attending Surgeon A/P   As noted, presents for evaluation of irritation around anus. On exam, there are several 2-5 mm benign appearing nodules at the level of anal verge/anal canal interphase. DRE was limited due to patient discomfort but no masses were detected. Patient recommended to undergo a colonoscopy and to refrain from shaving the perianal region as this may be irritating skin and leading to skin changes.   She will RTC if symptoms persist.  Questions answered .  I saw and evaluated the patient, participating in the key portions of the service.?? I reviewed the resident???s note.?? I agree with the resident???s findings and plan. Laverda Page, MD      Assessment   Alexis Rodgers is a 41 y.o. female has several small raised masses on the anal verge. They are appear benign and are skin colored. She has had them for several months and causes her irritation especially when shaving. We advised against surgical removal of these masses as it would likely cause more irritation and bleeding. The harms would outweigh the benefit of surgery.    Her bowel habits have not been consistent. Switching between diarrhea and constipation.   With her new bowel habits and these new masses we recommend a colonoscopy this year to look for any inflammatory or malignant masses.      Plan   ?? Colonoscopy   ?? GI clinic visit  ?? Decrease activities that increase irritation such as shaving around anal verge.     Subjective   HPI: Alexis Rodgers is a 41 y.o. female presents with several months of perianal irritation primarily associated with shaving. Also irritated with BM and has associated bleeding. She thought they were external hemorrhoids and presented today to have them surgically removed.   For past several months she has had constipation and diarrhea.   She has a PMH significant for psoriasis for which she takes Taltz (Ixekizumab) and anxiety for which she take buspirone.  She has had no past surgical history.       Review of Systems: A 12 system review of systems was negative except as noted in the HPI.    Allergies   Allergen Reactions   ??? Codeine      Other reaction(s): nausea and vomiting   ??? Penicillins Other (See Comments)     Other reaction(s): anaphylaxis   ??? Sulfa (Sulfonamide Antibiotics) Other (See Comments)     Other reaction(s): Unknown     Past Medical History:   Diagnosis Date   ??? Kidney stone      Past Surgical History:   Procedure Laterality Date   ??? SKIN BIOPSY       Current Outpatient Medications   Medication Sig Dispense Refill   ??? betamethasone valerate (VALISONE) 0.1 % ointment Apply topically Two (2) times a day. 45 g 2   ??? busPIRone (BUSPAR) 5 MG tablet Take 1 tablet (5 mg total) by mouth Three (3)  times a day. 90 tablet 0   ??? clobetasol (TEMOVATE) 0.05 % ointment Apply 1-2 times a day to affected areas as needed until smooth, repeat if needed. Avoid on face and skin folds. 60 g 5   ??? clobetasoL (TEMOVATE) 0.05 % ointment Apply topically Two (2) times a day. 60 g 1   ??? ixekizumab (TALTZ SYRINGE) 80 mg/mL Syrg Inject 80 mg under the skin. Inject contents of syringe once every 2 weeks     ??? calcipotriene (DOVONOX) 0.005 % cream Apply once daily on Saturday and Sunday on plaque psoriasis lesion 60 g 2   ??? calcipotriene (DOVONOX) 0.005 % cream Apply topically Two (2) times a day. 60 g 2   ??? ixekizumab (TALTZ SYRINGE) 80 mg/mL Syrg Inject the contents of 1 syringe (80 mg total) under the skin every twenty-eight (28) days. 1 mL 4   ??? ixekizumab 80 mg/mL Syrg SubQ: 160 mg once, followed by 80 mg at weeks 2, 4, 6, 8, 10 and 12, and then 80 mg every 4 weeks. (Patient not taking: Reported on 12/09/2021) 8 mL 0   ??? levocetirizine (XYZAL) 5 MG tablet Take 1 tablet by mouth every evening. (Patient not taking: Reported on 12/09/2021)     ??? norethindrone (MICRONOR) 0.35 mg tablet Take 1 tablet by mouth daily. 84 tablet 3   ??? triamcinolone (KENALOG) 0.1 % ointment Apply topically Two (2) times a day. M-F use topical steroid for 2 weeks (Patient not taking: Reported on 12/09/2021) 80 g 2     No current facility-administered medications for this visit.     Family History   Problem Relation Age of Onset   ??? Hypertension Mother    ??? Heart attack Father    ??? Breast cancer Maternal Grandmother    ??? Cancer Other         bladder   ??? Breast cancer Other    ??? Nephrolithiasis Other    ??? Melanoma Neg Hx    ??? Basal cell carcinoma Neg Hx    ??? Squamous cell carcinoma Neg Hx    ??? GU problems Neg Hx    ??? Kidney cancer Neg Hx    ??? Prostate cancer Neg Hx    ??? Ovarian cancer Neg Hx      Social History     Socioeconomic History   ??? Marital status: Divorced   Tobacco Use   ??? Smoking status: Former     Types: Cigarettes     Quit date: 01/15/2015     Years since quitting: 6.9   ??? Smokeless tobacco: Never   Vaping Use   ??? Vaping Use: Never used   Substance and Sexual Activity   ??? Alcohol use: Yes     Alcohol/week: 0.0 standard drinks     Comment: occ   ??? Drug use: No   ??? Sexual activity: Not Currently   Other Topics Concern   ??? Do you use sunscreen? Yes   ??? Tanning bed use? No   ??? Are you easily burned? No   ??? Excessive sun exposure? No   ??? Blistering sunburns? No     Objective     Vitals:    12/09/21 1022   BP: 142/95   Pulse: 91   Temp: 37 ??C (98.6 ??F)       Physical Exam:  General: alert, well appearing, in no acute distress  Abdominal: soft, nondistended, and nontender to palpation.   Extremities: warm and well perfused bilaterally, no edema  Skin: skin color, texture, and turgor are normal. No visible rashes or suspicious lesions  Rectal exam: approximately five 2-5 mm masses are present encircling the anal verge. The masses are skin colored white and pale. Symmetrical and raised. DRE was difficult to perform due to difficulty relaxing. No palpable masses were identified within the anal canal. No Blood present.  Musculoskeletal: no joint tenderness, deformity, swelling, or muscular tenderness noted. Full range of motion without pain  Neurologic: AAOx3. No visible tremor. Grossly intact neurologically  Psychiatric: alert and oriented to person, place, and time. Judgement and insight are appropriate.

## 2021-12-09 NOTE — Unmapped (Deleted)
LOWER GASTROINTESTINAL SURGERY CLINIC NOTE    Patient Name: Alexis Rodgers  Patient MRN: [id number]  Date of Service: 12/09/2021  Attending: Claretta Fraise, MD    Reason for Visit   ***    Assessment   Alexis Rodgers is a 41 y.o. female ***    Plan             Subjective   HPI: Alexis Rodgers is a 41 y.o. female ***    Review of Systems: A 12 system review of systems was negative except as noted in the HPI.    Allergies  Allergies   Allergen Reactions    Codeine      Other reaction(s): nausea and vomiting    Penicillins Other (See Comments)     Other reaction(s): anaphylaxis    Sulfa (Sulfonamide Antibiotics) Other (See Comments)     Other reaction(s): Unknown     Past Medical History  Past Medical History:   Diagnosis Date    Kidney stone      Past Surgical History   Past Surgical History:   Procedure Laterality Date    SKIN BIOPSY       Medications  Current Outpatient Medications   Medication Sig Dispense Refill    betamethasone valerate (VALISONE) 0.1 % ointment Apply topically Two (2) times a day. 45 g 2    busPIRone (BUSPAR) 5 MG tablet Take 1 tablet (5 mg total) by mouth Three (3) times a day. 90 tablet 0    clobetasol (TEMOVATE) 0.05 % ointment Apply 1-2 times a day to affected areas as needed until smooth, repeat if needed. Avoid on face and skin folds. 60 g 5    clobetasoL (TEMOVATE) 0.05 % ointment Apply topically Two (2) times a day. 60 g 1    ixekizumab (TALTZ SYRINGE) 80 mg/mL Syrg Inject 80 mg under the skin. Inject contents of syringe once every 2 weeks      calcipotriene (DOVONOX) 0.005 % cream Apply once daily on Saturday and Sunday on plaque psoriasis lesion 60 g 2    calcipotriene (DOVONOX) 0.005 % cream Apply topically Two (2) times a day. 60 g 2    ixekizumab (TALTZ SYRINGE) 80 mg/mL Syrg Inject the contents of 1 syringe (80 mg total) under the skin every twenty-eight (28) days. 1 mL 4    ixekizumab 80 mg/mL Syrg SubQ: 160 mg once, followed by 80 mg at weeks 2, 4, 6, 8, 10 and 12, and then 80 mg every 4 weeks. (Patient not taking: Reported on 12/09/2021) 8 mL 0    levocetirizine (XYZAL) 5 MG tablet Take 1 tablet by mouth every evening. (Patient not taking: Reported on 12/09/2021)      norethindrone (MICRONOR) 0.35 mg tablet Take 1 tablet by mouth daily. 84 tablet 3    triamcinolone (KENALOG) 0.1 % ointment Apply topically Two (2) times a day. M-F use topical steroid for 2 weeks (Patient not taking: Reported on 12/09/2021) 80 g 2     No current facility-administered medications for this visit.     Family History  Family History   Problem Relation Age of Onset    Hypertension Mother     Heart attack Father     Breast cancer Maternal Grandmother     Cancer Other         bladder    Breast cancer Other     Nephrolithiasis Other     Melanoma Neg Hx     Basal cell carcinoma  Neg Hx     Squamous cell carcinoma Neg Hx     GU problems Neg Hx     Kidney cancer Neg Hx     Prostate cancer Neg Hx     Ovarian cancer Neg Hx      Social History  Social History     Socioeconomic History    Marital status: Divorced   Tobacco Use    Smoking status: Former     Types: Cigarettes     Quit date: 01/15/2015     Years since quitting: 6.9    Smokeless tobacco: Never   Vaping Use    Vaping Use: Never used   Substance and Sexual Activity    Alcohol use: Yes     Alcohol/week: 0.0 standard drinks     Comment: occ    Drug use: No    Sexual activity: Not Currently   Other Topics Concern    Do you use sunscreen? Yes    Tanning bed use? No    Are you easily burned? No    Excessive sun exposure? No    Blistering sunburns? No     Objective     Vitals:    12/09/21 1022   BP: 142/95   Pulse: 91   Temp: 37 ??C (98.6 ??F)       Physical Exam:  General: {exam:62499::alert,well appearing,in no acute distress}  Cardiovascular: {exam:62501::regular rate,normotensive}  Pulmonary: {exam:62503::breathing comfortably on room air}  Abdominal: {exam:68184::soft,nondistended,nontender to palpation}. {Incisions/wounds:68185}. {Ostomy:62506} {ostomy details:77281}. {Drains:72395}  Extremities: {exam:62508::warm and well perfused bilaterally,no edema}  Skin: skin color, texture, and turgor are normal. No visible rashes or suspicious lesions  Musculoskeletal: no joint tenderness, deformity, swelling, or muscular tenderness noted. Full range of motion without pain  Neurologic: AAOx3. No visible tremor. Grossly intact neurologically  Psychiatric: alert and oriented to person, place, and time. Judgement and insight are appropriate

## 2022-01-01 DIAGNOSIS — L409 Psoriasis, unspecified: Principal | ICD-10-CM

## 2022-01-01 MED ORDER — TALTZ SYRINGE 80 MG/ML SUBCUTANEOUS
SUBCUTANEOUS | 4 refills | 28 days
Start: 2022-01-01 — End: ?

## 2022-01-07 ENCOUNTER — Ambulatory Visit: Admit: 2022-01-07 | Discharge: 2022-01-08 | Payer: PRIVATE HEALTH INSURANCE

## 2022-01-07 MED ORDER — TALTZ SYRINGE 80 MG/ML SUBCUTANEOUS
SUBCUTANEOUS | 4 refills | 28 days | Status: CP
Start: 2022-01-07 — End: ?
  Filled 2022-01-20: qty 1, 28d supply, fill #0

## 2022-01-07 NOTE — Unmapped (Signed)
[facility]  Outpatient Clinic Note     Patient Name: Alexis Rodgers   MRN #: [id number]   Date of Service: January 07, 2022    Admitting Physician: Sharyn Lull, *     Chief Complaint: Perianal skin tag       Patient Name: Alexis Rodgers  Patient MRN: [id number]  Date of Service: 01/07/2022  Attending: Sharyn Lull, *    Reason for Visit   Perianal skin tag    Assessment   Alexis Rodgers is a 41 y.o. female with a history of psoriasis who presents for evaluation of perianal skin tags. She has a history of thrombosed hemorrhoids and was left with perianal skin tags. She denies any symptoms or discomfort and on exam, her skin tags are small and circumferential with no stigmata of bleeding. She was advised that any surgery to remove these perianal skin tags may worsen the appearance of her anus after it heals and the wound separates. At this time, she does not want to pursue surgery and was advised to return to clinic if experiencing worsening skin tags or symptoms.     Plan   RTC as needed    I saw and evaluated the patient, participating in the key portions of the service.  I reviewed the resident???s note.  I agree with the resident???s findings and plan.  Total time spent: 30 minutes (review of records, history, examination, counseling).     Orlin Hilding, MD  Colorectal Surgery    Subjective   HPI: Alexis Rodgers is a 41 y.o. female with a history of psoriasis who presents for evaluation of perianal skin tags. She has a history of thrombosed hemorrhoids and when they healed she had some perianal skin tags left behind.    She says her hemorrhoids thrombosed about 2 years ago and that's when she noticed the skin tags. She denies any pain or bleeding per rectum. She did have some perianal itching in the past, but started using wet wipes instead of toilet paper to clean her bottom, which helped. She was advised to avoid using scented wipes as they can cause irritation. She denies any constipation and tries to consume high amounts of fiber in her diet and water.     Review of Systems: A 12 system review of systems was negative except as noted in the HPI.    Allergies   Allergen Reactions    Codeine      Other reaction(s): nausea and vomiting    Penicillins Other (See Comments)     Other reaction(s): anaphylaxis    Sulfa (Sulfonamide Antibiotics) Other (See Comments)     Other reaction(s): Unknown     Past Medical History:   Diagnosis Date    Kidney stone      Past Surgical History:   Procedure Laterality Date    SKIN BIOPSY       Current Outpatient Medications   Medication Sig Dispense Refill    betamethasone valerate (VALISONE) 0.1 % ointment Apply topically Two (2) times a day. 45 g 2    busPIRone (BUSPAR) 5 MG tablet Take 1 tablet (5 mg total) by mouth Three (3) times a day. 90 tablet 0    calcipotriene (DOVONOX) 0.005 % cream Apply once daily on Saturday and Sunday on plaque psoriasis lesion 60 g 2    calcipotriene (DOVONOX) 0.005 % cream Apply topically Two (2) times a day. 60 g 2  clobetasol (TEMOVATE) 0.05 % ointment Apply 1-2 times a day to affected areas as needed until smooth, repeat if needed. Avoid on face and skin folds. 60 g 5    ixekizumab (TALTZ SYRINGE) 80 mg/mL Syrg Inject 80 mg under the skin. Inject contents of syringe once every 2 weeks      ixekizumab 80 mg/mL Syrg SubQ: 160 mg once, followed by 80 mg at weeks 2, 4, 6, 8, 10 and 12, and then 80 mg every 4 weeks. (Patient not taking: Reported on 12/09/2021) 8 mL 0    levocetirizine (XYZAL) 5 MG tablet Take 1 tablet by mouth every evening. (Patient not taking: Reported on 12/09/2021)      norethindrone (MICRONOR) 0.35 mg tablet Take 1 tablet by mouth daily. 84 tablet 3    triamcinolone (KENALOG) 0.1 % ointment Apply topically Two (2) times a day. M-F use topical steroid for 2 weeks (Patient not taking: Reported on 12/09/2021) 80 g 2     No current facility-administered medications for this visit.     Family History   Problem Relation Age of Onset    Hypertension Mother     Heart attack Father     Breast cancer Maternal Grandmother     Cancer Other         bladder    Breast cancer Other     Nephrolithiasis Other     Melanoma Neg Hx     Basal cell carcinoma Neg Hx     Squamous cell carcinoma Neg Hx     GU problems Neg Hx     Kidney cancer Neg Hx     Prostate cancer Neg Hx     Ovarian cancer Neg Hx      Social History     Socioeconomic History    Marital status: Divorced     Spouse name: None    Number of children: None    Years of education: None    Highest education level: None   Tobacco Use    Smoking status: Former     Types: Cigarettes     Quit date: 01/15/2015     Years since quitting: 6.9    Smokeless tobacco: Never   Vaping Use    Vaping Use: Never used   Substance and Sexual Activity    Alcohol use: Yes     Alcohol/week: 0.0 standard drinks     Comment: occ    Drug use: No    Sexual activity: Not Currently   Other Topics Concern    Do you use sunscreen? Yes    Tanning bed use? No    Are you easily burned? No    Excessive sun exposure? No    Blistering sunburns? No     Objective     Vitals:    01/07/22 0848   BP: 127/87   Pulse: 84   Temp: 36.5 ??C (97.7 ??F)       Physical Exam:  General: alert, well appearing, in no acute distress  Cardiovascular: regular rate, normotensive  Pulmonary: breathing comfortably on room air  Abdominal: soft, nondistended, and nontender to palpation.  Extremities: warm and well perfused bilaterally, no edema  Skin: skin color, texture, and turgor are normal. No visible rashes or suspicious lesions  Musculoskeletal: no joint tenderness, deformity, swelling, or muscular tenderness noted. Full range of motion without pain  Neurologic: AAOx3. No visible tremor. Grossly intact neurologically  Psychiatric: alert and oriented to person, place, and time. Judgement and insight are appropriate  Anorectal: circumferential, small, perianal skin tags. Largest skin tag in right anterior but is still overall quite small. No bleeding, drainage, no tenderness to palpation. DRE not performed.

## 2022-01-09 NOTE — Unmapped (Signed)
The [facility] has made a second and final attempt to reach this patient to refill the following medication:Taltz.      We have been unable to leave messages on the following phone numbers: [phone] and [phone] (MB is full on both) and have sent a text message to the following phone numbers: [phone].    Dates contacted: 7/19 and 7/27  Last scheduled delivery: 6/28    The patient may be at risk of non-compliance with this medication. The patient should call the [facility] at [phone]  Option 4, then Option 2 (all other specialty patients) to refill medication.    Valere Dross   [facility] Specialty Technician

## 2022-01-17 NOTE — Unmapped (Signed)
[facility] Refill Coordination Note    Specialty Medication(s) to be Shipped:   Inflammatory Disorders: Taltz    Other medication(s) to be shipped: No additional medications requested for fill at this time     Alexis Rodgers, DOB: Nov 04, 1980  Phone: [phone] (home)       All above HIPAA information was verified with patient.     Was a Nurse, learning disability used for this call? No    Completed refill call assessment today to schedule patient's medication shipment from the [facility] [phone]).  All relevant notes have been reviewed.     Specialty medication(s) and dose(s) confirmed: Regimen is correct and unchanged.   Changes to medications: Alexis Rodgers reports no changes at this time.  Changes to insurance: No  New side effects reported not previously addressed with a pharmacist or physician: None reported  Questions for the pharmacist: No    Confirmed patient received a Conservation officer, historic buildings and a Surveyor, mining with first shipment. The patient will receive a drug information handout for each medication shipped and additional FDA Medication Guides as required.       DISEASE/MEDICATION-SPECIFIC INFORMATION        For patients on injectable medications: Patient currently has 0 doses left.  Next injection is scheduled for 01/12/22 .    SPECIALTY MEDICATION ADHERENCE     Medication Adherence    Patient reported X missed doses in the last month: 0  Specialty Medication: TALTZ SYRINGE 80 mg/mL Syrg (ixekizumab)  Patient is on additional specialty medications: No  Patient is on more than two specialty medications: No  Any gaps in refill history greater than 2 weeks in the last 3 months: no  Demonstrates understanding of importance of adherence: yes  Informant: patient        Were doses missed due to medication being on hold? No     REFERRAL TO PHARMACIST     Referral to the pharmacist: Not needed      [facility]     Shipping address confirmed in Epic.     Delivery Scheduled: Yes, Expected medication delivery date: 01/21/22 .     Medication will be delivered via UPS to the prescription address in Epic WAM.    Alexis Rodgers   [facility] Specialty Technician

## 2022-01-21 ENCOUNTER — Ambulatory Visit: Admit: 2022-01-21 | Discharge: 2022-01-22 | Payer: PRIVATE HEALTH INSURANCE

## 2022-02-18 NOTE — Unmapped (Signed)
[facility] Refill Coordination Note    Specialty Medication(s) to be Shipped:   Inflammatory Disorders: Taltz    Other medication(s) to be shipped: No additional medications requested for fill at this time     Rosa Clagett, DOB: Apr 06, 1981  Phone: [phone] (home)       All above HIPAA information was verified with patient.     Was a Nurse, learning disability used for this call? No    Completed refill call assessment today to schedule patient's medication shipment from the [facility] [phone]).  All relevant notes have been reviewed.     Specialty medication(s) and dose(s) confirmed: Regimen is correct and unchanged.   Changes to medications: Alvania reports no changes at this time.  Changes to insurance: No  New side effects reported not previously addressed with a pharmacist or physician: None reported  Questions for the pharmacist: No    Confirmed patient received a Conservation officer, historic buildings and a Surveyor, mining with first shipment. The patient will receive a drug information handout for each medication shipped and additional FDA Medication Guides as required.       DISEASE/MEDICATION-SPECIFIC INFORMATION        For patients on injectable medications: Patient currently has 0 doses left.  Next injection is scheduled for 9/8.    SPECIALTY MEDICATION ADHERENCE     Medication Adherence    Patient reported X missed doses in the last month: 0  Specialty Medication: Taltz  Patient is on additional specialty medications: No  Patient is on more than two specialty medications: No  Any gaps in refill history greater than 2 weeks in the last 3 months: no  Demonstrates understanding of importance of adherence: yes  Informant: patient  Reliability of informant: reliable              Confirmed plan for next specialty medication refill: delivery by pharmacy  Refills needed for supportive medications: not needed              Were doses missed due to medication being on hold? No    Taltz 80mg /ml: Patient has 0 days of medication on hand    REFERRAL TO PHARMACIST     Referral to the pharmacist: Not needed      [facility]     Shipping address confirmed in Epic.     Delivery Scheduled: Yes, Expected medication delivery date: 9/7.     Medication will be delivered via UPS to the prescription address in Epic WAM.    Olga Millers   [facility] Specialty Technician

## 2022-02-19 MED FILL — TALTZ SYRINGE 80 MG/ML SUBCUTANEOUS: SUBCUTANEOUS | 28 days supply | Qty: 1 | Fill #1

## 2022-03-17 NOTE — Unmapped (Signed)
[facility] Clinical Assessment & Refill Coordination Note    Alexis Rodgers, Alexis Rodgers: 1980/07/11  Phone: [phone] (home)     All above HIPAA information was verified with patient.     Was a Nurse, learning disability used for this call? No    Specialty Medication(s):   Inflammatory Disorders: Taltz     Current Outpatient Medications   Medication Sig Dispense Refill    betamethasone valerate (VALISONE) 0.1 % ointment Apply topically Two (2) times a day. 45 g 2    busPIRone (BUSPAR) 5 MG tablet Take 1 tablet (5 mg total) by mouth Three (3) times a day. 90 tablet 0    calcipotriene (DOVONOX) 0.005 % cream Apply once daily on Saturday and Sunday on plaque psoriasis lesion 60 g 2    calcipotriene (DOVONOX) 0.005 % cream Apply topically Two (2) times a day. 60 g 2    clobetasol (TEMOVATE) 0.05 % ointment Apply 1-2 times a day to affected areas as needed until smooth, repeat if needed. Avoid on face and skin folds. 60 g 5    ixekizumab (TALTZ SYRINGE) 80 mg/mL Syrg Inject 80 mg under the skin. Inject contents of syringe once every 2 weeks      ixekizumab (TALTZ SYRINGE) 80 mg/mL Syrg Inject the contents of 1 syringe (80 mg total) under the skin every twenty-eight (28) days. 1 mL 4    ixekizumab 80 mg/mL Syrg SubQ: 160 mg once, followed by 80 mg at weeks 2, 4, 6, 8, 10 and 12, and then 80 mg every 4 weeks. (Patient not taking: Reported on 12/09/2021) 8 mL 0    levocetirizine (XYZAL) 5 MG tablet Take 1 tablet by mouth every evening. (Patient not taking: Reported on 12/09/2021)      norethindrone (MICRONOR) 0.35 mg tablet Take 1 tablet by mouth daily. 84 tablet 3    triamcinolone (KENALOG) 0.1 % ointment Apply topically Two (2) times a day. M-F use topical steroid for 2 weeks (Patient not taking: Reported on 12/09/2021) 80 g 2     No current facility-administered medications for this visit.        Changes to medications: Maddigan reports no changes at this time.    Allergies   Allergen Reactions    Codeine Other reaction(s): nausea and vomiting    Penicillins Other (See Comments)     Other reaction(s): anaphylaxis    Sulfa (Sulfonamide Antibiotics) Other (See Comments)     Other reaction(s): Unknown       Changes to allergies: No    SPECIALTY MEDICATION ADHERENCE     Taltz 80 mg/ml: 0 days of medicine on hand       Medication Adherence    Patient reported X missed doses in the last month: 0  Specialty Medication: Taltz                            Specialty medication(s) dose(s) confirmed: Regimen is correct and unchanged.     Are there any concerns with adherence? No    Adherence counseling provided? Not needed    CLINICAL MANAGEMENT AND INTERVENTION      Clinical Benefit Assessment:    Do you feel the medicine is effective or helping your condition? Yes    Clinical Benefit counseling provided? Not needed    Adverse Effects Assessment:    Are you experiencing any side effects? No    Are you experiencing difficulty administering your medicine? No  Quality of Life Assessment:    Quality of Life    Rheumatology  Oncology  Dermatology  1. What impact has your specialty medication had on the symptoms of your skin condition (i.e. itchiness, soreness, stinging)?: Tremendous  2. What impact has your specialty medication had on your comfort level with your skin?: Tremendous  Cystic Fibrosis               Have you discussed this with your provider? Not needed    Acute Infection Status:    Acute infections noted within Epic:  No active infections  Patient reported infection: None    Therapy Appropriateness:    Is therapy appropriate and patient progressing towards therapeutic goals? Yes, therapy is appropriate and should be continued    DISEASE/MEDICATION-SPECIFIC INFORMATION      For patients on injectable medications: Patient currently has 0 doses left.  Next injection is scheduled for 9/6.    Chronic Inflammatory Diseases: Have you experienced any flares in the last month? No  Has this been reported to your provider? N/a    PATIENT SPECIFIC NEEDS     Does the patient have any physical, cognitive, or cultural barriers? No    Is the patient high risk? No    Did the patient require a clinical intervention? No    Does the patient require physician intervention or other additional services (i.e., nutrition, smoking cessation, social work)? No    SOCIAL DETERMINANTS OF HEALTH     At the [facility], we have learned that life circumstances - like trouble affording food, housing, utilities, or transportation can affect the health of many of our patients.   That is why we wanted to ask: are you currently experiencing any life circumstances that are negatively impacting your health and/or quality of life? Patient declined to answer    Social Determinants of Health     Financial Resource Strain: Not on file   Internet Connectivity: Not on file   Food Insecurity: Not on file   Tobacco Use: Medium Risk (01/21/2022)    Patient History     Smoking Tobacco Use: Former     Smokeless Tobacco Use: Never     Passive Exposure: Not on file   Housing/Utilities: Unknown (05/31/2021)    Housing/Utilities     Within the past 12 months, have you ever stayed: outside, in a car, in a tent, in an overnight shelter, or temporarily in someone else's home (i.e. couch-surfing)?: No     Are you worried about losing your housing?: Not on file     Within the past 12 months, have you been unable to get utilities (heat, electricity) when it was really needed?: Not on file   Alcohol Use: Not on file   Transportation Needs: Not on file   Substance Use: Not on file   Health Literacy: Low Risk  (05/31/2021)    Health Literacy     : Never   Physical Activity: Not on file   Interpersonal Safety: Not on file   Stress: Not on file   Intimate Partner Violence: Not At Risk (11/22/2021)    Humiliation, Afraid, Rape, and Kick questionnaire     Fear of Current or Ex-Partner: No     Emotionally Abused: No     Physically Abused: No     Sexually Abused: No   Depression: Not on file Social Connections: Not on file       Would you be willing to receive help with any of  the needs that you have identified today? Not applicable       SHIPPING     Specialty Medication(s) to be Shipped:   Inflammatory Disorders: Taltz    Other medication(s) to be shipped: No additional medications requested for fill at this time     Changes to insurance: No    Delivery Scheduled: Yes, Expected medication delivery date: 10/3.     Medication will be delivered via UPS to the confirmed prescription address in [facility].    The patient will receive a drug information handout for each medication shipped and additional FDA Medication Guides as required.  Verified that patient has previously received a Conservation officer, historic buildings and a Surveyor, mining.    The patient or caregiver noted above participated in the development of this care plan and knows that they can request review of or adjustments to the care plan at any time.      All of the patient's questions and concerns have been addressed.    Clydell Hakim, [name]   [facility] Specialty Pharmacist

## 2022-03-18 MED FILL — TALTZ SYRINGE 80 MG/ML SUBCUTANEOUS: SUBCUTANEOUS | 28 days supply | Qty: 1 | Fill #2

## 2022-04-04 DIAGNOSIS — L409 Psoriasis, unspecified: Principal | ICD-10-CM

## 2022-04-04 MED ORDER — CLOBETASOL 0.05 % TOPICAL OINTMENT
5 refills | 0.00000 days
Start: 2022-04-04 — End: ?

## 2022-04-07 MED ORDER — CLOBETASOL 0.05 % TOPICAL OINTMENT
OPHTHALMIC | 1 refills | 0.00000 days | Status: CP
Start: 2022-04-07 — End: ?

## 2022-04-07 NOTE — Unmapped (Signed)
[facility] Refill Coordination Note    Specialty Medication(s) to be Shipped:   Inflammatory Disorders: Taltz    Other medication(s) to be shipped: No additional medications requested for fill at this time     Tamesia Yeargin, DOB: 03-25-1981  Phone: [phone] (home)       All above HIPAA information was verified with patient.     Was a Nurse, learning disability used for this call? No    Completed refill call assessment today to schedule patient's medication shipment from the [facility] [phone]).  All relevant notes have been reviewed.     Specialty medication(s) and dose(s) confirmed: Regimen is correct and unchanged.   Changes to medications: Khora reports no changes at this time.  Changes to insurance: No  New side effects reported not previously addressed with a pharmacist or physician: None reported  Questions for the pharmacist: No    Confirmed patient received a Conservation officer, historic buildings and a Surveyor, mining with first shipment. The patient will receive a drug information handout for each medication shipped and additional FDA Medication Guides as required.       DISEASE/MEDICATION-SPECIFIC INFORMATION        For patients on injectable medications: Patient currently has 0 doses left.  Next injection is scheduled for 11/4.    SPECIALTY MEDICATION ADHERENCE     Medication Adherence    Patient reported X missed doses in the last month: 0  Specialty Medication: taltz syringe 80mg /ml                          Were doses missed due to medication being on hold? No    taltz syringe 80mg /ml  : 0 days of medicine on hand       REFERRAL TO PHARMACIST     Referral to the pharmacist: Not needed      [facility]     Shipping address confirmed in Epic.     Delivery Scheduled: Yes, Expected medication delivery date: 10/26.     Medication will be delivered via UPS to the prescription address in Epic WAM.    Westley Gambles   [facility] Specialty Technician

## 2022-04-09 MED FILL — TALTZ SYRINGE 80 MG/ML SUBCUTANEOUS: SUBCUTANEOUS | 28 days supply | Qty: 1 | Fill #3

## 2022-04-12 ENCOUNTER — Encounter: Payer: Self-pay | Admitting: Emergency Medicine

## 2022-04-12 ENCOUNTER — Ambulatory Visit
Admission: EM | Admit: 2022-04-12 | Discharge: 2022-04-12 | Disposition: A | Discharge status: Home / Self Care | Payer: Medicaid Other

## 2022-04-12 DIAGNOSIS — R21 Rash and other nonspecific skin eruption: Secondary | ICD-10-CM

## 2022-04-12 MED ORDER — TRIAMCINOLONE ACETONIDE 0.1 % EX CREA: 1.0000 | TOPICAL_CREAM | Freq: Two times a day (BID) | CUTANEOUS | 0 refills | Status: AC

## 2022-04-12 MED ORDER — ADAPALENE 0.1 % EX CREA: TOPICAL_CREAM | Freq: Every day | CUTANEOUS | 0 refills | Status: AC

## 2022-04-12 NOTE — ED Provider Notes (Signed)
[facility]    CSN: [id number] Arrival date & time: 04/12/22  0911      History   Chief Complaint No chief complaint on file.   HPI Joann Hatfield is a 41 y.o. female.   The history is provided by the patient.   Patient presents for complaints of rash to the left buttock that started approximately 2 to 3 days ago.  Patient states prior to her symptoms starting, she stated that she used a new body wash.  She states that she also has a new sexual partner, with intercourse approximately 2 weeks ago.  She was concerned that it may be HSV.  She states that the area looks like "blisters".  She states that the area is not itchy or painful.  She denies fever, chills, oozing, or drainage.  States she has been using Neosporin to the area.  States that she does have a history of recurrent skin conditions.  History reviewed. No pertinent past medical history.  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 No pertinent surgical history.  OB History   No obstetric history on file.      Home Medications    Prior to Admission medications   Medication Sig Start Date End Date Taking? Authorizing Provider  adapalene (DIFFERIN) 0.1 % cream Apply topically at bedtime. 04/12/22  Yes Oracio Galen-Warren, Alda Lea, NP  Ixekizumab (TALTZ [place]) Inject into the skin. Takes every 4 weeks   Yes [provider]  triamcinolone cream (KENALOG) 0.1 % Apply 1 Application topically 2 (two) times daily. 04/12/22  Yes Marysol Wellnitz-Warren, Alda Lea, NP  clobetasol cream (TEMOVATE) [id number] % Apply 1 application topically 2 (two) times daily.    [provider]  EPINEPHrine (EPIPEN 2-PAK) 0.3 mg/0.3 mL IJ SOAJ injection Inject 0.3 mLs (0.3 mg total) into the muscle once as needed for up to 1 dose. 05/27/18   Bobbitt, [name] Muta, MD   famotidine (PEPCID) 20 MG tablet Take 1 tablet (20 mg total) by mouth 2 (two) times daily. 05/10/18   Bobbitt, [name] Muta, MD  levocetirizine (XYZAL) 5 MG tablet Take 1 tablet (5 mg total) by mouth every evening. 05/10/18   Bobbitt, [name] Muta, MD  montelukast (SINGULAIR) 10 MG tablet Take 1 tablet (10 mg total) by mouth at bedtime. 05/10/18   Bobbitt, [name] Muta, MD  tacrolimus (PROTOPIC) 0.1 % ointment Apply topically 2 (two) times daily.    [provider]    Family History Family History  Problem Relation Age of Onset   Asthma Mother    Heart disease Father     Social History Social History   Tobacco Use   Smoking status: Former   Smokeless tobacco: Never  Substance Use Topics   Alcohol use: Never   Drug use: Never     Allergies   Penicillins and Sulfa antibiotics   Review of Systems Review of Systems Per HPI  Physical Exam Triage Vital Signs ED Triage Vitals  Enc Vitals Group     BP 04/12/22 0940 (!) 139/98     Pulse Rate 04/12/22 0940 98     Resp 04/12/22 0940 18     Temp 04/12/22 0940 98.9 F (37.2 C)     Temp Source 04/12/22 0940 Oral     SpO2 04/12/22 0940 99 %     Weight --  Height --      Head Circumference --      Peak Flow --      Pain Score 04/12/22 0941 0     Pain Loc --      Pain Edu? --      Excl. in [place]? --    No data found.  Updated Vital Signs BP (!) 139/98 (BP Location: Right Arm)   Pulse 98   Temp 98.9 F (37.2 C) (Oral)   Resp 18   LMP 03/18/2022 (Approximate)   SpO2 99%   Visual Acuity Right Eye Distance:   Left Eye Distance:   Bilateral Distance:    Right Eye Near:   Left Eye Near:    Bilateral Near:     Physical Exam Vitals and nursing note reviewed.  Constitutional:      General: She is not in acute distress.    Appearance: Normal appearance.  HENT:     Head: Normocephalic.  Eyes:     Extraocular Movements: Extraocular movements intact.     Pupils: Pupils are equal, round, and reactive to  light.  Musculoskeletal:     Cervical back: Normal range of motion.  Skin:    General: Skin is warm and dry.     Comments: Left buttock with lesions approximately 2 to 5 mm in diameter, with a shiny surface and central indentation  Neurological:     General: No focal deficit present.     Mental Status: She is alert and oriented to person, place, and time.  Psychiatric:        Mood and Affect: Mood normal.        Behavior: Behavior normal.      UC Treatments / Results  Labs (all labs ordered are listed, but only abnormal results are displayed) Labs Reviewed - No data to display  EKG   Radiology No results found.  Procedures Procedures (including critical care time)  Medications Ordered in UC Medications - No data to display  Initial Impression / Assessment and Plan / UC Course  I have reviewed the triage vital signs and the nursing notes.  Pertinent labs & imaging results that were available during my care of the patient were reviewed by me and considered in my medical decision making (see chart for details).  Patient presents for complaints of rash to the left buttock that has been present for the past 2 to 3 days.  Patient is well-appearing, she is in no acute distress.  Patient with lesions to the left buttocks central indentations that appear similar to molluscum contagiosum.  Will treat patient with adapalene 0.1% for the rash, and triamcinolone 0.1% cream for inflammation.  Supportive care recommendations were provided to the patient to include lukewarm baths, avoiding scratching, rubbing, or manipulating the areas.  Would like patient to trial medication for at least 7 to 10 days to see if there is any improvement.  Patient was advised if symptoms do not improve, recommend that she follow-up in this clinic or with her primary care physician for reevaluation.  Patient verbalizes understanding.  All questions were answered.  Patient is stable for discharge. Final Clinical  Impressions(s) / UC Diagnoses   Final diagnoses:  Rash and nonspecific skin eruption     Discharge Instructions      Take medication as prescribed. Avoid hot baths or showers while symptoms persist.  Recommend taking lukewarm baths. May apply cool cloths to the area to help with itching or discomfort. Avoid scratching, rubbing, or  manipulating the areas while symptoms persist. As discussed, use medication for at least 7 to 10 days.  Follow-up in this clinic or with your primary care physician if symptoms fail to improve.        ED Prescriptions     Medication Sig Dispense Auth. Provider   triamcinolone cream (KENALOG) 0.1 % Apply 1 Application topically 2 (two) times daily. 30 g Marrisa Kimber-Warren, Alda Lea, NP   adapalene (DIFFERIN) 0.1 % cream Apply topically at bedtime. 45 g Raetta Agostinelli-Warren, Alda Lea, NP      PDMP not reviewed this encounter.   Tish Men, NP 04/12/22 1016

## 2022-04-12 NOTE — Discharge Instructions (Addendum)
Take medication as prescribed. Avoid hot baths or showers while symptoms persist.  Recommend taking lukewarm baths. May apply cool cloths to the area to help with itching or discomfort. Avoid scratching, rubbing, or manipulating the areas while symptoms persist. As discussed, use medication for at least 7 to 10 days.  Follow-up in this clinic or with your primary care physician if symptoms fail to improve.

## 2022-04-12 NOTE — ED Triage Notes (Signed)
Rash on right buttocks that looks like blisters x 2 days.  States she used a different body wash on Wednesday and rash started after that.

## 2022-04-15 ENCOUNTER — Ambulatory Visit
Admission: EM | Admit: 2022-04-15 | Discharge: 2022-04-15 | Disposition: A | Discharge status: Home / Self Care | Payer: Medicaid Other

## 2022-04-15 DIAGNOSIS — R21 Rash and other nonspecific skin eruption: Secondary | ICD-10-CM | POA: Diagnosis not present

## 2022-04-15 NOTE — ED Triage Notes (Signed)
Patient to Urgent Care with complaints of worsening rash, started on Thursday. Initially started on her left buttock. Has now spread to between her toes and fingers. Areas are blisters, denies any pain or itching.   Reports multiple allergies, patient also takes immunosuppressant drug for psoriasis.    Has been using prescribed kenalog cream, has not used differin gel.

## 2022-04-15 NOTE — Discharge Instructions (Addendum)
Continue triamcinolone cream as directed.  Follow up with your primary care provider if your symptoms are not improving.

## 2022-04-15 NOTE — ED Provider Notes (Signed)
[facility]    CSN: [id number] Arrival date & time: 04/15/22  1400      History   Chief Complaint Chief Complaint  Patient presents with   Rash    HPI Joann Hatfield is a 41 y.o. female.  Patient presents with rash on her fingers and right great toe x2 days.  She initially had a rash on her buttock but this has resolved after 1 day of treatment with triamcinolone cream.  Her history includes psoriasis and eczema.  She denies fever, chills, or other symptoms.  Patient was seen at [facility] urgent care on 04/12/2022; diagnosed with a rash; treated with adapalene and triamcinolone.  She has not used the adapalene cream.  The history is provided by the patient and medical records.    Past Medical History:  Diagnosis Date   Psoriasis     Patient Active Problem List   Diagnosis Date Noted   Recurrent urticaria 05/10/2018   Allergic reaction 05/10/2018   Allergic rhinitis 05/10/2018   Atopic dermatitis 05/10/2018    History reviewed. No pertinent surgical history.  OB History   No obstetric history on file.      Home Medications    Prior to Admission medications   Medication Sig Start Date End Date Taking? Authorizing Provider  adapalene (DIFFERIN) 0.1 % cream Apply topically at bedtime. 04/12/22   Leath-Warren, Sadie Haber, NP  clobetasol cream (TEMOVATE) 0.05 % Apply 1 application topically 2 (two) times daily.    [provider]  EPINEPHrine (EPIPEN 2-PAK) 0.3 mg/0.3 mL IJ SOAJ injection Inject 0.3 mLs (0.3 mg total) into the muscle once as needed for up to 1 dose. 05/27/18   Bobbitt, Heywood Iles, MD  famotidine (PEPCID) 20 MG tablet Take 1 tablet (20 mg total) by mouth 2 (two) times daily. 05/10/18   Bobbitt, Heywood Iles, MD  Ixekizumab (TALTZ ) Inject into the skin. Takes every 4 weeks    [provider]  levocetirizine (XYZAL) 5 MG tablet Take 1 tablet (5 mg total) by mouth every evening. 05/10/18   Bobbitt, Heywood Iles, MD   montelukast (SINGULAIR) 10 MG tablet Take 1 tablet (10 mg total) by mouth at bedtime. 05/10/18   Bobbitt, Heywood Iles, MD  tacrolimus (PROTOPIC) 0.1 % ointment Apply topically 2 (two) times daily.    [provider]  triamcinolone cream (KENALOG) 0.1 % Apply 1 Application topically 2 (two) times daily. 04/12/22   Leath-Warren, Sadie Haber, NP    Family History Family History  Problem Relation Age of Onset   Asthma Mother    Heart disease Father     Social History Social History   Tobacco Use   Smoking status: Former   Smokeless tobacco: Never  Substance Use Topics   Alcohol use: Yes   Drug use: Never     Allergies   Penicillins and Sulfa antibiotics   Review of Systems Review of Systems  Constitutional:  Negative for chills and fever.  Skin:  Positive for rash. Negative for color change.  All other systems reviewed and are negative.    Physical Exam Triage Vital Signs ED Triage Vitals  Enc Vitals Group     BP 04/15/22 1423 137/83     Pulse Rate 04/15/22 1414 (!) 106     Resp 04/15/22 1414 18     Temp 04/15/22 1414 97.9 F (36.6 C)     Temp src --      SpO2 04/15/22 1414 97 %     Weight --  Height 04/15/22 1421 5\' 3"  (1.6 m)     Head Circumference --      Peak Flow --      Pain Score 04/15/22 1417 0     Pain Loc --      Pain Edu? --      Excl. in [place]? --    No data found.  Updated Vital Signs BP 137/83   Pulse (!) 106   Temp 97.9 F (36.6 C)   Resp 18   Ht 5\' 3"  (1.6 m)   LMP 03/18/2022 (Approximate)   SpO2 97%   BMI 26.93 kg/m   Visual Acuity Right Eye Distance:   Left Eye Distance:   Bilateral Distance:    Right Eye Near:   Left Eye Near:    Bilateral Near:     Physical Exam Vitals and nursing note reviewed.  Constitutional:      General: She is not in acute distress.    Appearance: Normal appearance. She is well-developed. She is not ill-appearing.  HENT:     Mouth/Throat:     Mouth: Mucous membranes are moist.   Cardiovascular:     Rate and Rhythm: Normal rate and regular rhythm.  Pulmonary:     Effort: Pulmonary effort is normal. No respiratory distress.  Musculoskeletal:     Cervical back: Neck supple.  Skin:    General: Skin is warm and dry.     Findings: Rash present.     Comments: One flesh-colored papule on right thumb. One light pink papule on left index finger. One pustule on right great toe with 1 cm localized erythema.  No drainage from any lesions.  Rash on buttock has resolved.    Neurological:     Mental Status: She is alert.  Psychiatric:        Mood and Affect: Mood normal.        Behavior: Behavior normal.      UC Treatments / Results  Labs (all labs ordered are listed, but only abnormal results are displayed) Labs Reviewed - No data to display  EKG   Radiology No results found.  Procedures Procedures (including critical care time)  Medications Ordered in UC Medications - No data to display  Initial Impression / Assessment and Plan / UC Course  I have reviewed the triage vital signs and the nursing notes.  Pertinent labs & imaging results that were available during my care of the patient were reviewed by me and considered in my medical decision making (see chart for details).    Rash.  Discussed with patient that rash on her hands may be dyshidrotic eczema as patient reports history of eczema; instructed her to use the triamcinolone cream on these areas.  Discussed that the rash that was on her buttock may have been folliculitis but to discontinue treatment since it has resolved.  Discussed topical antibiotic ointment for the pustule on her toe.  Patient is otherwise well-appearing.  Instructed her to follow-up with her PCP or dermatologist if her symptoms are not improving.  She agrees to plan of care.  Final Clinical Impressions(s) / UC Diagnoses   Final diagnoses:  Rash     Discharge Instructions      Continue triamcinolone cream as directed.  Follow  up with your primary care provider if your symptoms are not improving.        ED Prescriptions   None    PDMP not reviewed this encounter.   Sharion Balloon, NP 04/15/22 [phone]

## 2022-04-23 ENCOUNTER — Ambulatory Visit
Admit: 2022-04-23 | Discharge: 2022-04-24 | Payer: PRIVATE HEALTH INSURANCE | Attending: Nurse Practitioner | Primary: Nurse Practitioner

## 2022-04-23 DIAGNOSIS — L409 Psoriasis, unspecified: Principal | ICD-10-CM

## 2022-04-23 DIAGNOSIS — N898 Other specified noninflammatory disorders of vagina: Principal | ICD-10-CM

## 2022-04-23 NOTE — Unmapped (Signed)
Outpatient Gynecology Note - Established Patient    ASSESSMENT AND PLAN     Problem List Items Addressed This Visit    None  Visit Diagnoses       Vaginal itching    -  Primary    Relevant Orders    Wet Prep, Genital          Return if symptoms worsen or fail to improve.  Suggest cocnut oil for outside irritation  3. Sleep without underwear  4. Use only unscented soap like dove unscented and have partner use unscented soap in the genital area.  5. Use unscented detergent  6. Discussed possibility of herpes lesions healed though very doubtful without dsignificant symptoms since she never had before. If she wants can get blood for HSV 1 and 2 after 2 more weeks.     Alexis Rodgers, CNM for [facility]    SUBJECTIVE     This 41 y.o. G2P2000 is an established [facility] patient who presents for a problem visit.    HPI:  For last day and a half has felt itching and irritation particularly at entrance to vagina and not os much deep inside vagina. Used to have chronic yeast infections--but hasn't had any since stopped OCPS a few years ago.   Using dove unscented soap and BF uses [place] spring, she sleeps with cotton undies ad yesterday BF washed her clothes with Gain instead of dye free detergent.   Used a tapon yesteday for first time in a while--Kotex--and wonders if that irritated it.    Also describes last week being seen by PCP for sores on right iperineal area--diagnsed as folliculitis ( pt did not have burning or bad pain with them. No previous hx of herpes).   Does have sensitive skin with psoriais, rosacea. PCP gave her steroid cream. And she has stopped shaving in those areas    GYN History:   Menstrual cycles: regular  Last Pap: unknown   Abnormal Pap hx: none  STI history: negative  Sexually active: yes    Sex partner(s): female  Contraceptive method: partner w/ vasectomy    Past medical and surgical history was reviewed and updated.    Current Outpatient Medications   Medication Sig Dispense Refill    clobetasoL (TEMOVATE) 0.05 % ointment Apply 1-2 times a day to affected areas as needed until smooth, repeat if needed. Avoid on face and skin folds. 60 g 1    ixekizumab (TALTZ [place]) 80 mg/mL Syrg Inject the contents of 1 syringe (80 mg total) under the skin every twenty-eight (28) days. 1 mL 4    betamethasone valerate (VALISONE) 0.1 % ointment Apply topically Two (2) times a day. (Patient not taking: Reported on 04/23/2022) 45 g 2    busPIRone (BUSPAR) 5 MG tablet Take 1 tablet (5 mg total) by mouth Three (3) times a day. (Patient not taking: Reported on 04/23/2022) 90 tablet 0    calcipotriene (DOVONOX) 0.005 % cream Apply once daily on Saturday and Sunday on plaque psoriasis lesion (Patient not taking: Reported on 04/23/2022) 60 g 2    calcipotriene (DOVONOX) 0.005 % cream Apply topically Two (2) times a day. (Patient not taking: Reported on 04/23/2022) 60 g 2    cetirizine (ZYRTEC) 10 MG tablet Take 1 tablet (10 mg total) by mouth daily as needed for allergies.      ixekizumab (TALTZ SYRINGE) 80 mg/mL Syrg Inject 80 mg under the skin. Inject contents of syringe once every 2 weeks  ixekizumab 80 mg/mL Syrg SubQ: 160 mg once, followed by 80 mg at weeks 2, 4, 6, 8, 10 and 12, and then 80 mg every 4 weeks. (Patient not taking: Reported on 12/09/2021) 8 mL 0    levocetirizine (XYZAL) 5 MG tablet Take 1 tablet by mouth every evening. (Patient not taking: Reported on 12/09/2021)      norethindrone (MICRONOR) 0.35 mg tablet Take 1 tablet by mouth daily. (Patient not taking: Reported on 04/23/2022) 84 tablet 3    triamcinolone (KENALOG) 0.1 % ointment Apply topically Two (2) times a day. M-F use topical steroid for 2 weeks (Patient not taking: Reported on 12/09/2021) 80 g 2     No current facility-administered medications for this visit.       Allergies   Allergen Reactions    Codeine      Other reaction(s): nausea and vomiting    Penicillins Other (See Comments)     Other reaction(s): anaphylaxis    Sulfa (Sulfonamide Antibiotics) Other (See Comments)     Other reaction(s): Unknown       REVIEW OF SYSTEMS: See HPI; remaining balance of 10 systems are negative/non-contributory.    OBJECTIVE     BP 123/88  - Pulse 97  - Resp 18  - Ht 160 cm (5' 3)  - Wt 66.9 kg (147 lb 6.4 oz)  - LMP 04/18/2022 (Exact Date)  - BMI 26.11 kg/m??   General: well-appearing and in NAD    Pelvic:   external genitalia reddened at whole entrance to vagina with no lesions there. , BUS negative, vagina and cervix are without gross lesions, in right perineal area there are several open and pretty healing lesions which had been blistered. No discomfort. masses  Psych: pleasant and interactive, answers questions appropriately    LABS     Results for orders placed or performed in visit on 11/22/21   HPV DNA HI RISK   Result Value Ref Range    HPV DNA HI RISK Negative Negative   CBC   Result Value Ref Range    WBC 8.9 3.6 - 11.2 10*9/L    RBC 4.69 3.95 - 5.13 10*12/L    HGB 14.6 11.3 - 14.9 g/dL    HCT 16.1 09.6 - 04.5 %    MCV 91.6 77.6 - 95.7 fL    MCH 31.2 25.9 - 32.4 pg    MCHC 34.1 32.0 - 36.0 g/dL    RDW 40.9 81.1 - 91.4 %    MPV 7.6 6.8 - 10.7 fL    Platelet 302 150 - 450 10*9/L   Lipid Panel   Result Value Ref Range    Triglycerides 65 0 - 150 mg/dL    Cholesterol 782 <=956 mg/dL    HDL 60 40 - 60 mg/dL    LDL Calculated 64 40 - 99 mg/dL    VLDL Cholesterol Cal 13 9 - 37 mg/dL    Chol/HDL Ratio 2.3 1.0 - 4.5    Non-HDL Cholesterol 77 70 - 130 mg/dL    FASTING Yes    Comprehensive Metabolic Panel   Result Value Ref Range    Sodium 141 135 - 145 mmol/L    Potassium 4.2 3.4 - 4.8 mmol/L    Chloride 108 (H) 98 - 107 mmol/L    CO2 26.0 20.0 - 31.0 mmol/L    Anion Gap 7 5 - 14 mmol/L    BUN 10 9 - 23 mg/dL    Creatinine 2.13 0.86 - 0.80 mg/dL  BUN/Creatinine Ratio 16     eGFR CKD-EPI (2021) Female >90 >=60 mL/min/1.27m2    Glucose 85 70 - 99 mg/dL    Calcium 9.2 8.7 - 16.1 mg/dL    Albumin 4.1 3.4 - 5.0 g/dL    Total Protein 7.3 5.7 - 8.2 g/dL    Total Bilirubin 0.6 0.3 - 1.2 mg/dL    AST 15 <=09 U/L    ALT 13 10 - 49 U/L    Alkaline Phosphatase 61 46 - 116 U/L   TSH   Result Value Ref Range    TSH 0.780 0.550 - 4.780 uIU/mL   Pap Smear   Result Value Ref Range    Case Report       Gynecologic Cytology Report                       Case: [id number]                                 Authorizing Provider:  Rica Koyanagi, CNM          Collected:           11/22/2021 0933              Ordering Location:     [facility]  Received:            11/22/2021 1341                                     CROSSING [place]                                                         First Screen:          Milus Banister                                                            Rescreen:              Bernette Mayers                                                            Specimen:    Liquid-Based Pap Smear, Cervix                                                             Interpretation Negative for intraepithelial lesion or malignancy     Specimen Adequacy       Satisfactory for evaluation, endocervical/transformation zone component present    LMP 11/10/21  PAP EDUCATIONAL NOTE       The PAP smear is a screening test used as an aid in detecting cervical cancer and its precursors. Published data indicates that PAP smear testing is subject to both false negative and false positive results. It is NOT a diagnostic procedure and should not be used as the sole means of detecting cervical cancer. Periodic repeat testing and follow-up of any unexplained clinical signs and symptoms are recommended.    [facility] processes liquid-based PAP specimens with the [employer] T2000 and T5000 Processing system. After processing, ThinPrep ?? PAP specimens are analyzed by the [employer] ThinPrep ?? Imaging System, an automated imaging and review system which assists the lab in the evaluation of cells in the ThinPrep ?? PAP test. Following automated imaging, selected fields from every slide are reviewed by a cytotechnologist, and a pathologist if necessary.          Alexis Rodgers CNM for [facility]

## 2022-05-12 NOTE — Unmapped (Unsigned)
Patient's insurance is no longer covering [employer] but will cover [employer]. Patient would like to discuss Skyrizi with provider at her appointment on December 7th since she had a severe allergic reaction to Dupixent a few years ago.

## 2022-05-22 ENCOUNTER — Ambulatory Visit
Admit: 2022-05-22 | Discharge: 2022-05-23 | Payer: PRIVATE HEALTH INSURANCE | Attending: Student in an Organized Health Care Education/Training Program | Primary: Student in an Organized Health Care Education/Training Program

## 2022-05-22 DIAGNOSIS — Z79899 Other long term (current) drug therapy: Principal | ICD-10-CM

## 2022-05-22 DIAGNOSIS — L409 Psoriasis, unspecified: Principal | ICD-10-CM

## 2022-05-22 DIAGNOSIS — D229 Melanocytic nevi, unspecified: Principal | ICD-10-CM

## 2022-05-22 DIAGNOSIS — L280 Lichen simplex chronicus: Principal | ICD-10-CM

## 2022-05-22 DIAGNOSIS — D239 Other benign neoplasm of skin, unspecified: Principal | ICD-10-CM

## 2022-05-22 MED ORDER — SKYRIZI 150 MG/ML SUBCUTANEOUS PEN INJECTOR
SUBCUTANEOUS | 1 refills | 0.00000 days | Status: CP
Start: 2022-05-22 — End: ?
  Filled 2022-06-19: qty 1, 28d supply, fill #0

## 2022-05-22 NOTE — Unmapped (Addendum)
It was nice to see you today! Your resident physician was Dr. Eustace Quail    PLEASE GO TO [facility] FOR BLOOD DRAW TO TEST FOR TUBERCULOSIS.    If any of your medications are too expensive, look for a coupon at [email or web address] or reference the application on a smartphone  - Enter the medication name, size, and your zip code to find coupons for local pharmacies.   - Print a coupon and bring it to the pharmacy, or pull up the coupon on a smartphone.  - You can also call pharmacies to ask about the cost of your medication before you pick it up.  If you still cannot afford your medication, please let us know.     Please call our clinic at [phone] with any concerns or to schedule a follow up appointment. We look forward to seeing you again!    [facility] releases most results to you as soon as they are available. Therefore, you may see some results before we do. Please give [place] 2 business days to review the tests and contact you by phone or through MyChart. If you are concerned that some results may be upsetting or confusing, you may wish to wait until we contact you before looking at the report in MyChart. If you have an urgent question, you can send [place] a message or call our clinic. Otherwise, we prefer that you wait 2 business days for [place] to contact you.    Start urea 40% ointment nightly after showers to thickened skin on hands and feet.

## 2022-05-22 NOTE — Unmapped (Signed)
Dermatology Note     Assessment and Plan:      Benign Lesions/ Findings:   Nevus/Nevi-Benign Appearing  Dermatofibroma(s)  - Reassurance provided regarding the benign appearance of lesions noted on exam today; no treatment is indicated in the absence of symptoms/changes.  - Reinforced importance of photoprotective strategies including liberal and frequent sunscreen use of a broad-spectrum SPF 30 or greater, use of protective clothing, and sun avoidance for prevention of cutaneous malignancy and photoaging.  Counseled patient on the importance of regular self-skin monitoring as well as routine clinical skin examinations as scheduled.     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 Per patient, insurance prefers cozentyx or skyrizi. Joint decision made to try skyrizi as this is a different MOA from taltz and cosentyx. Patient aware that these can increase risk of infection, specifically TB. Educational information was given on skyrizi.   - risankizumab-rzaa (SKYRIZI) 150 mg/mL PnIj; INITIATION: SUBQ: 150 mg at weeks 0 and 4   - risankizumab-rzaa (SKYRIZI) 150 mg/mL PnIj; MAINTENANCE SUBQ: 150mg  every 12 weeks.  - Set expectation that this is a chronic condition and may not ever fully clear. Patient may still require clobetasol ointment despite systemic therapy. Continue BID PRN.  - Discussed with patient that tanning beds are not safe. Advised patient to NOT use tanning bed. Offered light box that has a filter on the UVA to limit UVA damage, patient declined due to busy schedule.     High risk medication use  - Quantiferon TB Gold Plus; Future    Lichen simplex chronicus  - Diagnosis, natural course, and treatment options were discussed with the patient.   - Start urea 40% ointment nightly after showers to thickened skin on hands and feet.    The patient was advised to call for an appointment should any new, changing, or symptomatic lesions develop.     RTC: Return in about 4 months (around 09/21/2022) for psoraisis. or sooner as needed   _________________________________________________________________      Chief Complaint     Chief Complaint   Patient presents with   ??? Skin Check     Annual FBSE   ??? Psoriasis     Patient states that her psoriasis is flaring up on her elbows again   ??? Medication Refill     For Altamease Oiler; however patient states that her insurance may not cover it and she may need another medication; she also states it never really completely worked       HPI     [employer] Figaro is a 41 y.o. female who presents as a returning patient (last seen 06/03/2021) to Dermatology for a full body skin exam. Patient reports no specific lesions or rash of concern. Patient also here to f/up on psoriasis.     Patient is on taltz for psoriasis. The psoriasis on extensor surfaces never go away despite taltz and clobetasol ointment BID. Last dose of taltz was about 30 days ago. Patient denies nail changes and joint pains. She is using her boyfriend's tanning bed. Patient says [employer] will not cover taltz any more and they prefer skyrizi or cosentyx. She is also interested in finding something else that will fully clear her psoriasis. Patient reports hx of allergy to dupixent.    She asks about bumps that came up on legs from bug bites that are stable and have been present for years.    Patient has thickened skin on areas of  hands and feet.    The patient denies any other new or changing lesions or areas of concern.     Pertinent Past Medical History     No history of skin cancer  Allergic Urticaria  Psoriasis    Past Medical History, Family History, Social History, Medication List, Allergies, and Problem List were reviewed in the rooming section of Epic.     ROS: Other than symptoms mentioned in the HPI, no fevers, chills, or other skin complaints    Physical Examination     GENERAL: Well-appearing female in no acute distress, resting comfortably.  NEURO: Alert and oriented, answers questions appropriately  PSYCH: Normal mood and affect  RESP: No increased work of breathing  SKIN (Full Skin Exam): Examination of the face, eyelids, lips, nose, ears, neck, chest, abdomen, back, arms, legs, hands, feet, palms, soles, nails was performed  - Nevus/nevi: Scattered well-demarcated, regular, pigmented macule(s) and/or papule(s) on the face, trunk, upper extremities, and lower extremities  - Dermatofibroma(s): Firm dermal nodule(s) with a hyperpigmented halo and a positive dimple sign located on the lower legs/ankles  - Lichenified, scaly, hyperpigmented plaque with excoriation on L thumb and tops of great toes  - pink patches on extensor surfaces    All areas not commented on are within normal limits or unremarkable  - skin exam limited by bra per patient preference  - scalp exam limited by hair  - facial exam limited by makeup  - nail exam limited by polish      (Approved Template 02/27/2020)

## 2022-05-23 DIAGNOSIS — L409 Psoriasis, unspecified: Principal | ICD-10-CM

## 2022-05-26 DIAGNOSIS — L409 Psoriasis, unspecified: Principal | ICD-10-CM

## 2022-05-28 NOTE — Unmapped (Unsigned)
[facility]   Patient Onboarding/Medication Counseling    Alexis Rodgers is Alexis 41 y.o. female with psoriasis who I am counseling today on initiation of therapy.  I am speaking to the patient.    Was Alexis Nurse, learning disability used for this call? No    Verified patient's date of birth / HIPAA.    Specialty medication(s) to be sent: Inflammatory Disorders: Skyrizi      Non-specialty medications/supplies to be sent: na      Medications not needed at this time: na         Skyrizi (risankizumab)    Medication & Administration     Dosage: Plaque psoriasis: Inject 150mg  under the skin at weeks 0 and 4, then every 12 weeks thereafter    Lab tests required prior to treatment initiation:  Tuberculosis: Tuberculosis screening resulted in Alexis non-reactive Quantiferon TB Gold assay.    Administration:     Artist all supplies needed for injection on Alexis clean, flat working surface: medication syringe(s) removed from packaging, alcohol swab, sharps container, etc.  Look at the medication label - look for correct medication, correct dose, and check the expiration date  Look at the medication - the liquid in the syringe should appear clear and colorless to slightly yellow, you may see tiny white or clear particles  Lay the syringe on Alexis flat surface and allow it to warm up to room temperature for at least 30 minutes  Select injection site - you can use the front of your thigh or your belly (but not the area 2 inches around your belly button); if someone else is giving you the injection you can also use your upper arm in the skin covering your triceps muscle  Prepare injection site - wash your hands and clean the skin at the injection site with an alcohol swab and let it air dry, do not touch the injection site again before the injection  Pull off the needle safety cap, do not remove until immediately prior to injection; turn the syringe so the needle is facing up and hold the syringe at eye level with one hand so you can see the air in the syringe; using your other hand, slowly push the plunger in to push the air out through the needle  Pinch the skin - with your hand not holding the syringe pinch up Alexis fold of skin at the injection site using your forefinger and thumb  Insert the needle into the fold of skin at about Alexis 45 degree angle - it's best to use Alexis quick dart-like motion  Push the plunger down slowly as far as it will go until the syringe is empty, if the plunger is not fully depressed the needle shield will not extend to cover the needle when it is removed, hold the syringe in place for Alexis full 5 seconds  Check that the syringe is empty and keep pressing down on the plunger while you pull the needle out at the same angle as inserted; after the needle is removed completely from the skin, release the plunger allowing the needle shield to activate and cover the used needle  Dispose of the used syringe immediately in your sharps disposal container, do not attempt to recap the needle prior to disposing  If you see any blood at the injection site, press Alexis cotton ball or gauze on the site and maintain pressure until the bleeding stops, do not rub the injection site  Pen:  Gather all supplies needed for injection on Alexis clean, flat working surface: medication pen removed from packaging, alcohol swab, sharps container, etc.  Look at the medication label - look for correct medication, correct dose, and check the expiration date  Look at the medication through the medication inspection window - the liquid in the pen should appear clear and colorless to slightly yellow, you may see tiny white or clear particles  Lay the pen on Alexis flat surface and allow it to warm up to room temperature for at least 30 minutes  Select injection site - you can use the front of your thigh or your belly (but not the area 2 inches around your belly button); if someone else is giving you the injection you can also use your upper arm in the skin covering your triceps muscle  Prepare injection site - wash your hands and clean the skin at the injection site with an alcohol swab and let it air dry, do not touch the injection site again before the injection  Hold the pen with your fingers on the gray grips so you can see the green activator button (it is on the side of the pen, not the top).  Pinch the skin - with your hand not holding the pen, pinch up Alexis fold of skin at the injection site using your forefinger and thumb  Press the white needle sleeve of the pen down against the pinched skin, then press the green activator button.  You will hear Alexis click, meaning the injection has started.  Note - the pen only activates if the white needle sleeve is pressed down before pressing the green activator button.  Continue holding the pen against your skin for 15 sections, until you hear Alexis second click, or until the yellow indicator has filled the medication inspection window.  The injection is now complete.  Slowly pull the pen straight out from your skin and dispose of the used pen immediately in your sharps disposal container.   If you see any blood at the injection site, press Alexis cotton ball or gauze on the site and maintain pressure until the bleeding stops, do not rub the injection site    On-Body Injector  Gather all supplies needed for injection on Alexis clean, flat working surface: Plastic tray containing the On-body injector and prefilled cartridge, alcohol swab, sharps container, etc.  Remove unopened carton from the refrigerator and allow it to warm up to room temperature for at least 45 but no more than 90 minutes.  Look at the medication label - look for correct medication, correct dose, and check the expiration date  Remove the On-body injector and prefilled cartridge from plastic tray  Look at the On-body injector - check that it is intact and undamaged. Do NOT close the gray door before the prefilled cartridge is loaded  Look at the prefilled cartridge - the liquid should appear clear and colorless to slightly yellow, you may see tiny white or clear particles. Do NOT twist or remove cartridge top.  Use an alcohol swab to clean the smaller bottom tip of the prefilled cartridge and let it air dry. Do not touch the smaller bottom tip after cleaning.  Insert the smaller bottom tip into the On-Body injector first. Firmly push down until you hear Alexis click. There may be Alexis few drops of medicine on the back of the On-body Injector. That is normal. Close the gray door and squeeze firmly until it snaps closed.  You MUST start the injection within 5 minutes of loading the prefilled cartridge.   Select injection site - you can use the front of your thigh or your belly (but not the area 2 inches around your belly button)  Prepare injection site - wash your hands and clean the skin at the injection site with an alcohol swab and let it air dry, do not touch the injection site again before the injection  Peel the green tabs on the back of the On-body injector to expose the adhesive. This will activate the On-body injector and cause the status light to turn BLUE.   For the belly, move and hold the skin to create Alexis firm, flat surface. You do not need to pull the skin if injecting on the front of the thighs.  When the blue light flashes, it is ready to start the injection.  Place the On-body injector onto the cleaned skin and then firmly press the gray button until you hear Alexis click. This will start the injection and the light will continuously flash GREEN. This may take up to 5 minutes to complete the full dose.  The On-body Injector will automatically stop when the injection is finished. You will hear beeps and the status light will change to SOLID GREEN.   Remove the On-Body Injector by carefully peeling the adhesive from your skin. Avoid touching the needle or needle cover on the back of the On-Body Injector. You will hear several beeps and the status light will turn off.   Dispose of the used On-Body Injector immediately in Engineer, water.      Adherence/Missed dose instructions:  If your injection is given more than 7 days after your scheduled injection date - consult your pharmacist for additional instructions on how to adjust your dosing schedule.    Goals of Therapy     Crohn's Disease  Achieve remission of symptoms  Maintain remission of symptoms  Minimize long-term systemic glucocorticoid use  Prevent need for surgical procedures  Maintenance of effective psychosocial functioning    Plaque Psoriasis  Minimize areas of skin involvement (% BSA)  Avoidance of long term glucocorticoid use  Maintenance of effective psychosocial functioning    Psoriatic Arthritis  Achieve remission/inactive disease or low/minimal disease activity  Maintenance of function  Minimization of systemic manifestations and comorbidities  Maintenance of effective psychosocial functioning      Side Effects & Monitoring Parameters     Injection site reaction (redness, irritation, inflammation localized to the site of administration)  Signs of Alexis common cold - minor sore throat, runny or stuffy nose, etc.  Felling tired/weak  Headache  Stomach, joint or back pain    The following side effects should be reported to the provider:  Signs of Alexis hypersensitivity reaction - rash; hives; itching; red, swollen, blistered, or peeling skin; wheezing; tightness in the chest or throat; difficulty breathing, swallowing, or talking; swelling of the mouth, face, lips, tongue, or throat; etc.  Reduced immune function - report signs of infection such as fever; chills; body aches; very bad sore throat; ear or sinus pain; cough; more sputum or change in color of sputum; pain with passing urine; wound that will not heal, etc.  Also at Alexis slightly higher risk of some malignancies (mainly skin and blood cancers) due to this reduced immune function.  In the case of signs of infection - the patient should hold the next dose of Skyrizi?? and call your primary care provider to ensure adequate  medical care.  Treatment may be resumed when infection is treated and patient is asymptomatic.  Flu-like symptoms  Warm, red, or painful skin or sores on the body  Severe diarrhea or stomach pain      Contraindications, Warnings, & Precautions     Have your bloodwork checked as you have been told by your prescriber  Talk with your doctor if you are pregnant, planning to become pregnant, or breastfeeding  Discuss the possible need for holding your dose(s) of Skyrizi?? when Alexis planned procedure is scheduled with the prescriber as it may delay healing/recovery timeline       Drug/Food Interactions     Medication list reviewed in Epic. The patient was instructed to inform the care team before taking any new medications or supplements. No drug interactions identified.   Talk with you prescriber or pharmacist before receiving any live vaccinations while taking this medication and after you stop taking it    Storage, Handling Precautions, & Disposal     Store this medication in the refrigerator.  Do not freeze   If needed, you may store at room temperature for up to 24 hours  Store in original packaging, protected from light  Do not shake  Dispose of used syringes/pens in Alexis sharps disposal container          Current Medications (including OTC/herbals), Comorbidities and Allergies     Current Outpatient Medications   Medication Sig Dispense Refill    cetirizine (ZYRTEC) 10 MG tablet Take 1 tablet (10 mg total) by mouth daily as needed for allergies.      clobetasoL (TEMOVATE) 0.05 % ointment Apply 1-2 times Alexis day to affected areas as needed until smooth, repeat if needed. Avoid on face and skin folds. 60 g 1    risankizumab-rzaa (SKYRIZI) 150 mg/mL PnIj Inject the contents of 1 pen (150 mg total) under the skin at weeks 0 and 4. Loading dose. 2 mL 0    risankizumab-rzaa (SKYRIZI) 150 mg/mL PnIj Inject the contents of 1 pen (150 mg total) under the skin every 12 weeks. Maintenance dose. 1 mL 1     No current facility-administered medications for this visit.       Allergies   Allergen Reactions    Dupixent Pen [Dupilumab]     Codeine      Other reaction(s): nausea and vomiting    Penicillins Other (See Comments)     Other reaction(s): anaphylaxis    Sulfa (Sulfonamide Antibiotics) Other (See Comments)     Other reaction(s): Unknown       Patient Active Problem List   Diagnosis    Rosacea    COVID-19    Anxiety    Well woman exam with routine gynecological exam    Initiation of oral contraception    Allergic reaction    Allergic rhinitis    Allergic urticaria    Atopic dermatitis    Psoriasis    Sinusitis       Reviewed and up to date in Epic.    Appropriateness of Therapy     Acute infections noted within Epic:  No active infections  Patient reported infection: None    Is medication and dose appropriate based on diagnosis and infection status? Yes    Prescription has been clinically reviewed: Yes      Baseline Quality of Life Assessment      How many days over the past month did your psoriasis  keep you from your normal activities? For example,  brushing your teeth or getting up in the morning. 0    Financial Information     Medication Assistance provided: Prior Authorization    Anticipated copay of $0/$5 after patient-obtained copay card reviewed with patient. Verified delivery address.    Delivery Information     Scheduled delivery date: 1/5    Expected start date: 1/5    Medication will be delivered via UPS to the prescription address in Epic [place].  This shipment will not require Alexis signature.      Explained the services we provide at [facility] and that each month we would call to set up refills.  Stressed importance of returning phone calls so that we could ensure they receive their medications in time each month.  Informed patient that we should be setting up refills 7-10 days prior to when they will run out of medication.  Alexis pharmacist will reach out to perform Alexis clinical assessment periodically.  Informed patient that Alexis welcome packet, containing information about our pharmacy and other support services, Alexis Notice of Privacy Practices, and Alexis drug information handout will be sent.      The patient or caregiver noted above participated in the development of this care plan and knows that they can request review of or adjustments to the care plan at any time.      Patient or caregiver verbalized understanding of the above information as well as how to contact the pharmacy at [phone] option 4 with any questions/concerns.  The pharmacy is open Monday through Friday 8:30am-4:30pm.  Alexis pharmacist is available 24/7 via pager to answer any clinical questions they may have.    Patient Specific Needs     Does the patient have any physical, cognitive, or cultural barriers? No    Does the patient have adequate living arrangements? (i.e. the ability to store and take their medication appropriately) Yes    Did you identify any home environmental safety or security hazards? No    Patient prefers to have medications discussed with  Patient     Is the patient or caregiver able to read and understand education materials at Alexis high school level or above? Yes    Patient's primary language is  English     Is the patient high risk? No    SOCIAL DETERMINANTS OF HEALTH     At the [facility], we have learned that life circumstances - like trouble affording food, housing, utilities, or transportation can affect the health of many of our patients.   That is why we wanted to ask: are you currently experiencing any life circumstances that are negatively impacting your health and/or quality of life? Patient declined to answer    Social Determinants of Health     Financial Resource Strain: Not on file   Internet Connectivity: Not on file   Food Insecurity: Not on file   Tobacco Use: Medium Risk (05/22/2022)    Patient History     Smoking Tobacco Use: Former     Smokeless Tobacco Use: Never     Passive Exposure: Not on file   Housing/Utilities: Unknown (05/31/2021)    Housing/Utilities     Within the past 12 months, have you ever stayed: outside, in Alexis car, in Alexis tent, in an overnight shelter, or temporarily in someone else's home (i.e. couch-surfing)?: No     Are you worried about losing your housing?: Not on file     Within the past 12 months, have you  been unable to get utilities (heat, electricity) when it was really needed?: Not on file   Alcohol Use: Not on file   Transportation Needs: Not on file   Substance Use: Not on file   Health Literacy: Low Risk  (05/31/2021)    Health Literacy     : Never   Physical Activity: Not on file   Interpersonal Safety: Not on file   Stress: Not on file   Intimate Partner Violence: Not At Risk (11/22/2021)    Humiliation, Afraid, Rape, and Kick questionnaire     Fear of Current or Ex-Partner: No     Emotionally Abused: No     Physically Abused: No     Sexually Abused: No   Depression: Not on file   Social Connections: Not on file       Would you be willing to receive help with any of the needs that you have identified today? Not applicable       Alexis Rodgers Alexis [facility] Specialty Pharmacist Substance Use: Not on file   Health Literacy: Low Risk  (05/31/2021)    Health Literacy     : Never   Physical Activity: Not on file   Interpersonal Safety: Not on file   Stress: Not on file   Intimate Partner Violence: Not At Risk (11/22/2021)    Humiliation, Afraid, Rape, and Kick questionnaire     Fear of Current or Ex-Partner: No     Emotionally Abused: No     Physically Abused: No     Sexually Abused: No   Depression: Not on file   Social Connections: Not on file       Would you be willing to receive help with any of the needs that you have identified today? {Yes/No/Not applicable:93005}       Alexis Rodgers Alexis Rodgers [facility] Specialty Pharmacist

## 2022-07-16 NOTE — Unmapped (Signed)
PA INITIATED FOR Skyrizi 150MG /ML syringes Key: [id number]   SENT TO PLAN TODAY

## 2022-07-18 NOTE — Unmapped (Signed)
[facility] Clinical Assessment & Refill Coordination Note    Alexis Rodgers, [place]: [birth year]  Phone: [phone] (home)     All above HIPAA information was verified with patient.     Was a Nurse, learning disability used for this call? No    Specialty Medication(s):   Inflammatory Disorders: Skyrizi     Current Outpatient Medications   Medication Sig Dispense Refill    cetirizine (ZYRTEC) 10 MG tablet Take 1 tablet (10 mg total) by mouth daily as needed for allergies.      clobetasoL (TEMOVATE) 0.05 % ointment Apply 1-2 times a day to affected areas as needed until smooth, repeat if needed. Avoid on face and skin folds. 60 g 1    risankizumab-rzaa (SKYRIZI) 150 mg/mL Syrg Inject the contents of 1 syringe (150 mg total) under the skin at weeks 0 and 4. Loading dose. 2 mL 0    risankizumab-rzaa (SKYRIZI) 150 mg/mL PnIj Inject the contents of 1 pen (150 mg total) under the skin every 12 weeks. Maintenance dose. 1 mL 1     No current facility-administered medications for this visit.        Changes to medications: Kendl reports no changes at this time.    Allergies   Allergen Reactions    Dupixent Pen [Dupilumab]     Codeine      Other reaction(s): nausea and vomiting    Penicillins Other (See Comments)     Other reaction(s): anaphylaxis    Sulfa (Sulfonamide Antibiotics) Other (See Comments)     Other reaction(s): Unknown       Changes to allergies: No    SPECIALTY MEDICATION ADHERENCE          Specialty medication(s) dose(s) confirmed: Regimen is correct and unchanged.     Are there any concerns with adherence? No    Adherence counseling provided? Not needed    CLINICAL MANAGEMENT AND INTERVENTION      Clinical Benefit Assessment:    Do you feel the medicine is effective or helping your condition? Yes    Clinical Benefit counseling provided?  After [employer], patient is starting to see benefits and skin is calming down     Adverse Effects Assessment:    Are you experiencing any side effects? No    Are you experiencing difficulty administering your medicine? No    Quality of Life Assessment:    Quality of Life    Rheumatology  Oncology  Dermatology  Cystic Fibrosis          How many days over the past month did your psoriasis  keep you from your normal activities? For example, brushing your teeth or getting up in the morning. 0    Have you discussed this with your provider? Not needed    Acute Infection Status:    Acute infections noted within Epic:  No active infections  Patient reported infection: None    Therapy Appropriateness:    Is therapy appropriate and patient progressing towards therapeutic goals? Yes, therapy is appropriate and should be continued    DISEASE/MEDICATION-SPECIFIC INFORMATION      For patients on injectable medications: Patient currently has 0 doses left.  Next injection is scheduled for 2/7.    Chronic Inflammatory Diseases: Have you experienced any flares in the last month? No    PATIENT SPECIFIC NEEDS     Does the patient have any physical, cognitive, or cultural barriers? No    Is the patient high risk? No  Did the patient require a clinical intervention? No    Does the patient require physician intervention or other additional services (i.e., nutrition, smoking cessation, social work)? No    SOCIAL DETERMINANTS OF HEALTH     At the [facility], we have learned that life circumstances - like trouble affording food, housing, utilities, or transportation can affect the health of many of our patients.   That is why we wanted to ask: are you currently experiencing any life circumstances that are negatively impacting your health and/or quality of life? No    Social Determinants of Health     Financial Resource Strain: Not on file   Internet Connectivity: Not on file   Food Insecurity: Not on file   Tobacco Use: Medium Risk (05/22/2022)    Patient History     Smoking Tobacco Use: Former     Smokeless Tobacco Use: Never     Passive Exposure: Not on file   Housing/Utilities: Unknown (05/31/2021)    Housing/Utilities     Within the past 12 months, have you ever stayed: outside, in a car, in a tent, in an overnight shelter, or temporarily in someone else's home (i.e. couch-surfing)?: No     Are you worried about losing your housing?: Not on file     Within the past 12 months, have you been unable to get utilities (heat, electricity) when it was really needed?: Not on file   Alcohol Use: Not on file   Transportation Needs: Not on file   Substance Use: Not on file   Health Literacy: Low Risk  (05/31/2021)    Health Literacy     : Never   Physical Activity: Not on file   Interpersonal Safety: Not on file   Stress: Not on file   Intimate Partner Violence: Not At Risk (11/22/2021)    Humiliation, Afraid, Rape, and Kick questionnaire     Fear of Current or Ex-Partner: No     Emotionally Abused: No     Physically Abused: No     Sexually Abused: No   Depression: Not on file   Social Connections: Not on file       Would you be willing to receive help with any of the needs that you have identified today? Not applicable       SHIPPING     Specialty Medication(s) to be Shipped:   Inflammatory Disorders: Skyrizi    Other medication(s) to be shipped: No additional medications requested for fill at this time     Changes to insurance: No    Delivery Scheduled: Yes, Expected medication delivery date: 2/6.     Medication will be delivered via UPS to the confirmed prescription address in [facility].    The patient will receive a drug information handout for each medication shipped and additional FDA Medication Guides as required.  Verified that patient has previously received a Conservation officer, historic buildings and a Surveyor, mining.    The patient or caregiver noted above participated in the development of this care plan and knows that they can request review of or adjustments to the care plan at any time.      All of the patient's questions and concerns have been addressed.    [name], [name]   [facility] Specialty Pharmacist

## 2022-07-21 MED FILL — SKYRIZI 150 MG/ML SUBCUTANEOUS SYRINGE: 84 days supply | Qty: 1 | Fill #1

## 2022-10-06 NOTE — Unmapped (Signed)
[facility] Refill Coordination Note    Alexis Rodgers, [place]: 1980-12-07  Phone: [phone] (home)       All above HIPAA information was verified with patient.         10/06/2022     9:04 AM   Specialty Rx Medication Refill Questionnaire   Which Medications would you like refilled and shipped? Skyrizo   Please list all current allergies: , penicillin, sulfa, codeine   Have you missed any doses in the last 30 days? No   Have you had any changes to your medication(s) since your last refill? No   How many days remaining of each medication do you have at home? 0   If receiving an injectable medication, next injection date is 10/15/2022   Have you experienced any side effects in the last 30 days? No   Please enter the full address (street address, city, state, zip code) where you would like your medication(s) to be delivered to. [address] , [place], [place] [address]   Please specify on which day you would like your medication(s) to arrive. Note: if you need your medication(s) within 3 days, please call the pharmacy to schedule your order at [phone]  10/09/2022   Has your insurance changed since your last refill? No   Would you like a pharmacist to call you to discuss your medication(s)? No   Do you require a signature for your package? (Note: if we are billing Medicare Part B or your order contains a controlled substance, we will require a signature) No         Completed refill call assessment today to schedule patient's medication shipment from the [facility] [phone]).  All relevant notes have been reviewed.       Confirmed patient received a Conservation officer, historic buildings and a Surveyor, mining with first shipment. The patient will receive a drug information handout for each medication shipped and additional FDA Medication Guides as required.         REFERRAL TO PHARMACIST     Referral to the pharmacist: Not needed      [facility]     Shipping address confirmed in Epic. Delivery Scheduled: Yes, Expected medication delivery date: 4/25.     Medication will be delivered via UPS to the prescription address in Epic WAM.    Alexis Rodgers   [facility] Specialty Technician

## 2022-10-08 MED FILL — SKYRIZI 150 MG/ML SUBCUTANEOUS PEN INJECTOR: 84 days supply | Qty: 1 | Fill #0

## 2022-12-12 ENCOUNTER — Ambulatory Visit: Admit: 2022-12-12 | Discharge: 2022-12-12 | Payer: PRIVATE HEALTH INSURANCE

## 2022-12-12 DIAGNOSIS — Z1239 Encounter for other screening for malignant neoplasm of breast: Principal | ICD-10-CM

## 2022-12-12 DIAGNOSIS — Z124 Encounter for screening for malignant neoplasm of cervix: Principal | ICD-10-CM

## 2022-12-12 DIAGNOSIS — F419 Anxiety disorder, unspecified: Principal | ICD-10-CM

## 2022-12-12 DIAGNOSIS — Z01419 Encounter for gynecological examination (general) (routine) without abnormal findings: Principal | ICD-10-CM

## 2022-12-12 NOTE — Unmapped (Addendum)
Pap UTD, desires repeat  STI screening offered and declined  Contraception: Partner has Vasectomy  Breast Cancer Screening: Mammogram ordered to have done a [employer]  GYN complaints: perimenopausal symptoms  Preventative health/labs: needs to establish care with PCP

## 2022-12-12 NOTE — Unmapped (Signed)
Took buspar on twice, made her feel more irritable  Denies any  need for anti-anxiety medication at this time

## 2022-12-12 NOTE — Unmapped (Signed)
Provider: Rica Koyanagi, CNM  Division: [facility]    Outpatient Gynecology Note: Annual Visit    Assessment/Plan:      Alexis Rodgers is a 42 y.o. female G2P2000 with normal well-woman gynecologic exam.     Problem List Items Addressed This Visit          Other    Anxiety     Took buspar on twice, made her feel more irritable  Denies any  need for anti-anxiety medication at this time           Well woman exam with routine gynecological exam - Primary     Pap UTD, desires repeat  STI screening offered and declined  Contraception: Partner has Vasectomy  Breast Cancer Screening: Mammogram ordered to have done a [place]  GYN complaints: perimenopausal symptoms  Preventative health/labs: needs to establish care with PCP           Other Visit Diagnoses       Cervical cancer screening        Relevant Orders    Pap Smear    Encounter for screening for malignant neoplasm of breast, unspecified screening modality        Relevant Orders    Mammo Digital Screening Bilateral            > Pap smear:  Pap w/ HPV obtained  > Wet prep: Not done  > Mammography: yearly beginning at age 76  > Labs: Pap Smear   > Reviewed health maintenance including  diet and nutrition, vitamins, supplements, and herbals, adequate intake of calcium and vitamin D, regular exercise, adequate sleep, seatbelt use, contraception, BSE, mammagraphy screening/results, and pap smear screening/results.    Current Outpatient Medications   Medication Sig Dispense Refill    cetirizine (ZYRTEC) 10 MG tablet Take 1 tablet (10 mg total) by mouth daily as needed for allergies.      clobetasoL (TEMOVATE) 0.05 % ointment Apply 1-2 times a day to affected areas as needed until smooth, repeat if needed. Avoid on face and skin folds. 60 g 1    risankizumab-rzaa (SKYRIZI) 150 mg/mL PnIj Inject the contents of 1 pen (150 mg total) under the skin every 12 weeks. Maintenance dose. 1 mL 1    risankizumab-rzaa (SKYRIZI) 150 mg/mL Syrg Inject the contents of 1 syringe (150 mg total) under the skin at weeks 0 and 4. Loading dose. 2 mL 0     No current facility-administered medications for this visit.       Return in about 1 year (around 12/12/2023) for Annual physical or sooner.       Subjective:      Alexis Rodgers is a 42 y.o. female G2P2000 who presents for annual exam. Patient's last menstrual period was 11/29/2022 (exact date). Periods are regular every 28-30 days and last  3-4  days, light in flow. Dysmenorrhea: none. Cyclic symptoms include: none. No intermenstrual bleeding, spotting, or discharge. Patient reports missing 1 period in March of this year and has had some hot flashes and night sweats since last fall. Patient reports she started taking herbel hormonal remedy to help with symptoms and symptoms have improved, denies any hot flashes or night sweats. Patient reports she wants Pap screenings yearly.     Health Maintenance  > Contraceptive methods: vasectomy.  > Regular self breast exam: yes  > History of abnormal mammogram: no  > Domestic Violence History: no  > Exercise: cardiovascular workout on exercise equipment  > Dietary Supplements:  multivitamin  > Seat Belt Use: yes      Current Outpatient Medications   Medication Sig Dispense Refill    cetirizine (ZYRTEC) 10 MG tablet Take 1 tablet (10 mg total) by mouth daily as needed for allergies.      clobetasoL (TEMOVATE) 0.05 % ointment Apply 1-2 times a day to affected areas as needed until smooth, repeat if needed. Avoid on face and skin folds. 60 g 1    risankizumab-rzaa (SKYRIZI) 150 mg/mL PnIj Inject the contents of 1 pen (150 mg total) under the skin every 12 weeks. Maintenance dose. 1 mL 1    risankizumab-rzaa (SKYRIZI) 150 mg/mL Syrg Inject the contents of 1 syringe (150 mg total) under the skin at weeks 0 and 4. Loading dose. 2 mL 0     No current facility-administered medications for this visit.     Allergies   Allergen Reactions    Dupixent Pen [Dupilumab]     Codeine      Other reaction(s): nausea and vomiting Penicillins Other (See Comments)     Other reaction(s): anaphylaxis    Sulfa (Sulfonamide Antibiotics) Other (See Comments)     Other reaction(s): Unknown       Past Medical History:   Diagnosis Date    Initiation of oral contraception 12/05/2021    12/05/21 Rx'd mini pill       Kidney stone      Past Surgical History:   Procedure Laterality Date    SKIN BIOPSY       OB History       Gravida   2    Para   2    Term   2    Preterm        AB        Living             SAB        IAB        Ectopic        Molar        Multiple        Live Births                  Social History     Tobacco Use    Smoking status: Former     Current packs/day: 0.00     Types: Cigarettes     Quit date: 01/15/2015     Years since quitting: 7.9    Smokeless tobacco: Never   Substance Use Topics    Alcohol use: Yes     Alcohol/week: 0.0 standard drinks of alcohol     Comment: occ     Social History     Substance and Sexual Activity   Sexual Activity Not Currently       Immunization History   Administered Date(s) Administered    INFLUENZA TIV (TRI) 70MO+ W/ PRESERV (IM) 05/05/2006    Influenza LAIV (Nasal-Tri) HISTORICAL 04/18/2005         Review Of Systems   A comprehensive review of 14 systems was negative except for pertinent positives noted in HPI.       Objective:      BP 131/99  - Pulse 73  - Wt 68.9 kg (152 lb)  - LMP 11/29/2022 (Exact Date)  - BMI 26.93 kg/m??     Constitutional: Well-developed, well-nourished female in no acute distress  Neurological: Alert and oriented to person, place, and time  Psychiatric: Mood and affect appropriate  Skin:  No rashes or lesions  Neck: Supple without masses. Trachea is midline.Thyroid is normal size without masses  Lymphatics: No cervical, axillary, supraclavicular, or inguinal adenopathy noted  Respiratory: Clear to auscultation bilaterally. Good air movement with normal work of breathing.  Cardiovascular: Regular rate and rhythm. Extremities grossly normal, nontender with no edema; pulses regular  Gastrointestinal: Soft, nontender, nondistended. No masses or hernias appreciated. No hepatosplenomegaly. No fluid wave. No rebound or guarding.  Breast Exam: normal appearance, no masses or tenderness, No nipple retraction or dimpling, No nipple discharge or bleeding, No axillary or supraclavicular adenopathy, Normal to palpation without dominant masses  Genitourinary:          External Genitalia: Normal female genitalia     Urethra: Midline, no masses     Bladder: Well-suspended, NT     Vagina: smooth, no lesions.     Cervix: No lesions, normal size and consistency; no cervical motion tenderness, Pap smear obtained     Uterus: Normal size and contour; smooth, mobile, NT, anteverted.  Adnexae: Non-palpable and non-tender  Perineum/Anus: No lesions         The sensitive parts of the examination were performed with  a chaperone

## 2022-12-15 DIAGNOSIS — Z7689 Persons encountering health services in other specified circumstances: Principal | ICD-10-CM

## 2022-12-22 NOTE — Unmapped (Signed)
[facility] Refill Coordination Note    Alexis Rodgers, [place]: 1980/10/26  Phone: [phone] (home)       All above HIPAA information was verified with patient.         12/16/2022     2:33 PM   Specialty Rx Medication Refill Questionnaire   Which Medications would you like refilled and shipped? Skyrizi   Please list all current allergies: Penecillin, sulfa, codeine   Have you missed any doses in the last 30 days? No   Have you had any changes to your medication(s) since your last refill? No   How many days remaining of each medication do you have at home? 0   If receiving an injectable medication, next injection date is 01/07/2023   Have you experienced any side effects in the last 30 days? No   Please enter the full address (street address, city, state, zip code) where you would like your medication(s) to be delivered to. [address] [place] [address] [place], [place] [address]   Please specify on which day you would like your medication(s) to arrive. Note: if you need your medication(s) within 3 days, please call the pharmacy to schedule your order at [phone]  01/05/2023   Has your insurance changed since your last refill? No   Would you like a pharmacist to call you to discuss your medication(s)? No   Do you require a signature for your package? (Note: if we are billing Medicare Part B or your order contains a controlled substance, we will require a signature) No         Completed refill call assessment today to schedule patient's medication shipment from the [facility] [phone]).  All relevant notes have been reviewed.       Confirmed patient received a Conservation officer, historic buildings and a Surveyor, mining with first shipment. The patient will receive a drug information handout for each medication shipped and additional FDA Medication Guides as required.         REFERRAL TO PHARMACIST     Referral to the pharmacist: Not needed      [facility]     Shipping address confirmed in Epic. Delivery Scheduled: Yes, Expected medication delivery date: 7/19.     Medication will be delivered via UPS to the prescription address in Epic WAM.    [name]   [facility] Specialty Technician

## 2023-01-01 MED FILL — SKYRIZI 150 MG/ML SUBCUTANEOUS PEN INJECTOR: 84 days supply | Qty: 1 | Fill #1

## 2023-01-06 DIAGNOSIS — F419 Anxiety disorder, unspecified: Principal | ICD-10-CM

## 2023-01-06 MED ORDER — PAROXETINE 20 MG TABLET
ORAL_TABLET | ORAL | 2 refills | 0.00000 days | Status: CP
Start: 2023-01-06 — End: ?

## 2023-01-14 NOTE — Unmapped (Signed)
Called patient and LVM for refill, but then realized we sent out the medication 01/01/2023 for an 84 days' supply. LVM to have her ignore my first VM.    Bray[place], [place]. D.  Clinical Pharmacist - [facility]  [address], [place], [place] [address]  Phone: [phone]

## 2023-01-21 DIAGNOSIS — T753XXA Motion sickness, initial encounter: Principal | ICD-10-CM

## 2023-01-21 MED ORDER — SCOPOLAMINE 1 MG OVER 3 DAYS TRANSDERMAL PATCH
MEDICATED_PATCH | TRANSDERMAL | 0 refills | 12 days | Status: CP
Start: 2023-01-21 — End: ?

## 2023-01-25 DIAGNOSIS — L409 Psoriasis, unspecified: Principal | ICD-10-CM

## 2023-01-25 MED ORDER — CLOBETASOL 0.05 % TOPICAL OINTMENT
1 refills | 0.00000 days
Start: 2023-01-25 — End: ?

## 2023-01-26 MED ORDER — CLOBETASOL 0.05 % TOPICAL OINTMENT
OPHTHALMIC | 1 refills | 0.00000 days | Status: CP
Start: 2023-01-26 — End: ?

## 2023-01-26 NOTE — Unmapped (Signed)
Rx refill, medication pended please fill if appropriate

## 2023-02-09 ENCOUNTER — Ambulatory Visit: Admit: 2023-02-09 | Discharge: 2023-02-10 | Payer: PRIVATE HEALTH INSURANCE

## 2023-03-03 DIAGNOSIS — Z30011 Encounter for initial prescription of contraceptive pills: Principal | ICD-10-CM

## 2023-03-03 MED ORDER — NORETHINDRONE (CONTRACEPTIVE) 0.35 MG TABLET
ORAL | 3 refills | 84 days | Status: CP
Start: 2023-03-03 — End: 2024-03-02

## 2023-03-18 DIAGNOSIS — L409 Psoriasis, unspecified: Principal | ICD-10-CM

## 2023-03-18 MED ORDER — RISANKIZUMAB-RZAA 150 MG/ML SUBCUTANEOUS PEN INJECTOR
1 refills | 0 days
Start: 2023-03-18 — End: ?

## 2023-03-18 NOTE — Unmapped (Signed)
Called, left voicemail to schedule an appointment     Thanks  Toni Amend

## 2023-03-19 MED ORDER — RISANKIZUMAB-RZAA 150 MG/ML SUBCUTANEOUS PEN INJECTOR
SUBCUTANEOUS | 0 refills | 0.00000 days | Status: CP
Start: 2023-03-19 — End: ?
  Filled 2023-03-31: qty 1, 84d supply, fill #0

## 2023-03-27 NOTE — Unmapped (Signed)
The [facility] has made a second and final attempt to reach this patient to refill the following medication:Skyrizi.      We have left voicemails on the following phone numbers: [phone]  [phone], have sent a text message to the following phone numbers: [phone], and have sent a Mychart questionnaire..    Dates contacted: 03/20/2023   03/27/2023  Last scheduled delivery: 01/02/2023    The patient may be at risk of non-compliance with this medication. The patient should call the [facility] at [phone]  Option 4, then Option 2: Dermatology, Gastroenterology, Rheumatology to refill medication.    Orlean Patten

## 2023-03-31 NOTE — Unmapped (Signed)
[facility] Refill Coordination Note    Alexis Rodgers, [place]: 13-Jan-1981  Phone: [phone] (home)       All above HIPAA information was verified with patient.     *clarified we could do delivery on 10/16 instead.        03/27/2023     5:53 PM   Specialty Rx Medication Refill Questionnaire   Which Medications would you like refilled and shipped? Skyrizi   Please list all current allergies: Penicllin, sulfa, codiene   Have you missed any doses in the last 30 days? No   Have you had any changes to your medication(s) since your last refill? No   How many days remaining of each medication do you have at home? 0   Have you experienced any side effects in the last 30 days? No   Please enter the full address (street address, city, state, zip code) where you would like your medication(s) to be delivered to. [address] [place], [place] [address]   Please specify on which day you would like your medication(s) to arrive. Note: if you need your medication(s) within 3 days, please call the pharmacy to schedule your order at [phone]  03/31/2023   Has your insurance changed since your last refill? No   Would you like a pharmacist to call you to discuss your medication(s)? No   Do you require a signature for your package? (Note: if we are billing Medicare Part B or your order contains a controlled substance, we will require a signature) No         Completed refill call assessment today to schedule patient's medication shipment from the [facility] [phone]).  All relevant notes have been reviewed.       Confirmed patient received a Conservation officer, historic buildings and a Surveyor, mining with first shipment. The patient will receive a drug information handout for each medication shipped and additional FDA Medication Guides as required.         REFERRAL TO PHARMACIST     Referral to the pharmacist: Not needed      [facility]     Shipping address confirmed in Epic. Delivery Scheduled: Yes, Expected medication delivery date: 10/16.     Medication will be delivered via UPS to the prescription address in Epic WAM.    Deshauna Cayson [facility] Specialty Pharmacist

## 2023-05-25 NOTE — Unmapped (Signed)
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 Maintenance dose. Refilled today.   - Previously reviewed risk of infection, specifically TB and provided educational information.  - Set expectation that this is a chronic condition and may not ever fully clear. Patient may still require clobetasol ointment despite systemic therapy.   - Continue clobetasol 0.05% ointment; Apply 1-2 times a day to affected areas as needed until smooth. Repeat if needed. Avoid on face and skin folds.  - Discussed with patient that tanning beds are not safe. Advised patient to NOT use tanning bed. Offered light box that has a filter on the UVA to limit UVA damage, patient declined due to busy schedule.     High risk medication use  - Last Quantiferon Gold obtained on 06/03/2021 - Negative   - Reordered today.    Rosacea- chronic, flaring  - Discussed the chronic inflammatory nature of the condition  - Discussed oxymetazoline cream once daily PRN to reduce erythema temporarily, however patient declined. Discussed the risk of rebound erythema   - Encouraged the daily use of a broad spectrum sunscreen with at least SPF 50 to the face and chest.  - Discussed that the erythema and telangiectasias will likely not fade completely with treatment and usually require treatment with PDL for significant improvement.  - Recommended use of make-up with a green base to camouflage redness    The patient was advised to call for an appointment should any new, changing, or symptomatic lesions develop.     RTC: Return in about 1 year (around 05/27/2024) for follow up of psoriasis. or sooner as needed   _________________________________________________________________      Chief Complaint     Chief Complaint   Patient presents with    Psoriasis     Follow-up: patient states that her psoriasis has improved a lot with the [facility]. Patient also wants to inquire about her rosacea     HPI     Alexis Rodgers is a 42 y.o. female who presents as a returning patient (last seen by Dr. Eustace Quail and Dr. Bennie Hind on 05/22/2022) to Dermatology for follow up of psoriasis. 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    Psoriasis  - Reports she has not had side effects from [place]  - States she had one flare patch of psoriasis which resolved after treating with clobetasol  - Endorses she is using a very small amount of clobetasol     Rosacea  - States she has some redness on the cheeks that she has been treating with an over-the-counter cream  - Denies that she gets raised bumps in association with her rosacea    The patient denies any other new or changing lesions or areas of concern.     Pertinent Past Medical History     No history of skin cancer    Allergic Urticaria  Psoriasis    Family History:  Positive for Skin Cancer- Grandfather    Derm Social History:  Reports history of tanning bed use    Past Medical History, Family History,  Social History, Medication List, Allergies, and Problem List were reviewed in the rooming section of Epic.     ROS: Other than symptoms mentioned in the HPI, no fevers, chills, or other skin complaints    Physical Examination     GENERAL: Well-appearing female in no acute distress, resting comfortably.  NEURO: Alert and oriented, answers questions appropriately  PSYCH: Normal mood and affect  SKIN: Examination of the face, bilateral upper extremities, and bilateral lower extremities was performed  - Bilateral arms and legs clear today.  - Bilateral cheeks with mild erythema and several telangiectatic vessels.    All areas not commented on are within normal limits or unremarkable    Scribe's Attestation: Cruzita Lederer, MD obtained and performed the history, physical exam and medical decision making elements that were entered into the chart. Signed by Cherlynn Kaiser, Scribe, on May 28, 2023 at 8:22 AM.    ----------------------------------------------------------------------------------------------------------------------  May 28, 2023 8:52 AM. Documentation assistance provided by the Scribe. I was present during the time the encounter was recorded. The information recorded by the Scribe was done at my direction and has been reviewed and validated by me.  ----------------------------------------------------------------------------------------------------------------------      (Approved Template 02/27/2020)

## 2023-05-28 ENCOUNTER — Ambulatory Visit
Admit: 2023-05-28 | Discharge: 2023-05-28 | Payer: BLUE CROSS/BLUE SHIELD | Attending: Student in an Organized Health Care Education/Training Program | Primary: Student in an Organized Health Care Education/Training Program

## 2023-05-28 ENCOUNTER — Ambulatory Visit: Admit: 2023-05-28 | Discharge: 2023-05-28 | Payer: BLUE CROSS/BLUE SHIELD

## 2023-05-28 DIAGNOSIS — Z79899 Other long term (current) drug therapy: Principal | ICD-10-CM

## 2023-05-28 DIAGNOSIS — L409 Psoriasis, unspecified: Principal | ICD-10-CM

## 2023-05-28 MED ORDER — SKYRIZI 150 MG/ML SUBCUTANEOUS SYRINGE
SUBCUTANEOUS | 3 refills | 0.00 days | Status: CP
Start: 2023-05-28 — End: ?
  Filled 2023-06-11: qty 1, 84d supply, fill #0

## 2023-05-28 MED ORDER — RISANKIZUMAB-RZAA 150 MG/ML SUBCUTANEOUS PEN INJECTOR
0 refills | 0.00 days | Status: CN
Start: 2023-05-28 — End: ?

## 2023-05-28 NOTE — Unmapped (Addendum)
For your psoriasis:    - Apply clobetasol ointment twice daily as needed for breakthrough psoriasis flares    - Continue skyrizi injections every 12 weeks      Basic Skin Care  Your skin plays an important role in keeping the entire body healthy.  Below are some tips on how to try and maximize skin health from the outside in.    Bathe in mildly warm water every 1 to 2 days, followed by light drying and an application of a thick moisturizer cream or ointment, preferably one that comes in a tub.  Fragrance free moisturizing bars or body washes are preferred such as Purpose, Cetaphil, Dove sensitive skin, Aveeno, or Vanicream products.  Use a fragrance free cream or ointment, not a lotion, such as plain petroleum jelly or Vaseline ointment, Aquaphor, Vanicream, Eucerin cream or a generic version, CeraVe Cream, Cetaphil Restoraderm, Aveeno Eczema Therapy and [employer], among others.  People with very dry skin often need to put on these creams two, three or four times a day.  As much as possible, use these creams enough to keep the skin from looking dry.  Consider using fragrance free/dye free detergent, such as Arm and Hammer for sensitive skin, Tide Free or All Free.     If I am prescribing a medication to go on the skin, the medicine goes on first to the areas that need it, followed by a thick cream as above to the entire body.    Wynelle Link is a major cause of damage to the skin.  I recommend sun protection for all of my patients. I prefer physical barriers such as hats with wide brims that cover the ears, long sleeve clothing with SPF protection including rash guards for swimming. These can be found seasonally at outdoor clothing companies, [employer] and [employer] and online at Liz Claiborne.com, www.uvskinz.com and BrideEmporium.nl. Avoid peak sun between the hours of 10am to 3pm to minimize sun exposure.    I recommend sunscreen for all of my patients older than 34 months of age when in the sun, preferably with broad spectrum coverage and SPF 30 or higher.   For sensitive skin: I recommend sunscreens that only contain titanium dioxide and/or zinc oxide in the active ingredients. These do not burn the eyes and appear to be safer than chemical sunscreens. Products includ Vanicream Broad Spectrum 50+, Aveeno Natural Mineral Protection, Neutrogena Pure and Free Baby, [employer] Daily face and body lotion, and EltaMD.  There is no such thing as waterproof sunscreen. All sunscreens should be reapplied after 60-80 minutes of wear.   Iii. For those that prefer not to use cream-based sunscreen, there are alternatives such as SPRAYS and STICKS.  These are also good options if you are sweaty or working out.  Spray on sunscreens often use chemical sunscreens which do protect against the sun. However, these can be difficult to apply correctly, especially if wind is present, and can be more likely to irritate the skin.  I recommend applying it at least twice over the involved areas.  For the stick, I recommend Neutrogena.  It looks like a deodorant stick.  It must be applied 4 times over the same area to protect as much as the cream.      I also recommend discussing Vitamin D supplementation with your primary care doctor as patients typically do not get enough Vitamin D through the skin in our area, even without using sunscreen.

## 2023-05-29 LAB — QUANTIFERON TB GOLD PLUS
QUANTIFERON ANTIGEN 1 MINUS NIL: 0.02 [IU]/mL
QUANTIFERON ANTIGEN 2 MINUS NIL: 0 [IU]/mL
QUANTIFERON MITOGEN: 9.99 [IU]/mL
QUANTIFERON TB GOLD PLUS: NEGATIVE
QUANTIFERON TB NIL VALUE: 0.01 [IU]/mL

## 2023-05-29 LAB — TB MITOGEN: TB MITOGEN VALUE: 10

## 2023-05-29 LAB — TB AG2: TB AG2 VALUE: 0.01

## 2023-05-29 LAB — TB NIL: TB NIL VALUE: 0.01

## 2023-05-29 LAB — TB AG1: TB AG1 VALUE: 0.03

## 2023-06-02 NOTE — Unmapped (Signed)
Clinical Assessment Needed For: Formulation Change  Medication: SKYRIZI 150 mg/mL Syrg (risankizumab-rzaa)  Last Fill Date/Day Supply: 03/30/23 / 84 days  Copay $0  Was previous dose already scheduled to fill: No    Notes to Pharmacist:

## 2023-06-06 ENCOUNTER — Ambulatory Visit
Admit: 2023-06-06 | Discharge: 2023-06-07 | Payer: BLUE CROSS/BLUE SHIELD | Attending: Student in an Organized Health Care Education/Training Program | Primary: Student in an Organized Health Care Education/Training Program

## 2023-06-06 DIAGNOSIS — J069 Acute upper respiratory infection, unspecified: Principal | ICD-10-CM

## 2023-06-06 MED ORDER — AZITHROMYCIN 250 MG TABLET
ORAL_TABLET | 0 refills | 5.00 days | Status: CP
Start: 2023-06-06 — End: 2023-06-11

## 2023-06-06 NOTE — Unmapped (Signed)
ASSESSMENT/PLAN:     Pt with URI. Symptomatic treatment recommended. Pt advised on signs and symptoms that would warrant returning to Urgent Care or Emergency Department. Pt voices understanding. Pt adivsed to return to Urgent Care or Emergency Department if symptoms persist or worsen.      Diagnosis ICD-10-CM Associated Orders   1. URI, acute  J06.9 POCT COLD/FLU/RSV COMBO SWAB - RN OBTAIN     azithromycin (ZITHROMAX Z-PAK) 250 MG tablet        Requested Prescriptions     Signed Prescriptions Disp Refills    azithromycin (ZITHROMAX Z-PAK) 250 MG tablet 6 tablet 0     Sig: Take 2 tablets (500 mg) on  Day 1,  followed by 1 tablet (250 mg) once daily on Days 2 through 5.     Instructions about new medications and side effects provided.  If any changes in chronic medications then new reconciled medication list is given to patient.     CHIEF COMPLAINT:     Chief Complaint   Patient presents with    Nasal Congestion     X 3 days- a lot of pressure and congestion in head and headache       SUBJECTIVE/HPI:   42 y.o. Female presents with nasal congestion and sinus pressure x3 days. Pt has taken OTC medication for relief. Pt denies fever.    Past Medical History:   Diagnosis Date    Initiation of oral contraception 12/05/2021    12/05/21 Rx'd mini pill       Kidney stone      Allergies   Allergen Reactions    Dupixent Pen [Dupilumab]     Codeine      Other reaction(s): nausea and vomiting    Penicillins Other (See Comments)     Other reaction(s): anaphylaxis    Sulfa (Sulfonamide Antibiotics) Other (See Comments)     Other reaction(s): Unknown     Social History     Socioeconomic History    Marital status: Divorced     Spouse name: None    Number of children: None    Years of education: None    Highest education level: None   Tobacco Use    Smoking status: Former     Current packs/day: 0.00     Types: Cigarettes     Quit date: 01/15/2015     Years since quitting: 8.3     Passive exposure: Never    Smokeless tobacco: Never Vaping Use    Vaping status: Never Used   Substance and Sexual Activity    Alcohol use: Yes     Alcohol/week: 0.0 standard drinks of alcohol     Comment: occ    Drug use: No    Sexual activity: Not Currently   Other Topics Concern    Do you use sunscreen? Yes    Tanning bed use? No    Are you easily burned? No    Excessive sun exposure? No    Blistering sunburns? No     Family History   Problem Relation Age of Onset    Hypertension Mother     Heart attack Father     Breast cancer Maternal Grandmother     Cancer Other         bladder    Nephrolithiasis Other     Melanoma Neg Hx     Basal cell carcinoma Neg Hx     Squamous cell carcinoma Neg Hx     GU problems Neg  Hx     Kidney cancer Neg Hx     Prostate cancer Neg Hx     Ovarian cancer Neg Hx     Endometrial cancer Neg Hx     Colon cancer Neg Hx        ROS:   Review of Systems   HENT:  Positive for congestion and sinus pressure.      See Subjective/HPI  Medications, Allergies and Problem List personally reviewed in Epic today    OBJECTIVE:          06/06/23 1430   BP: 133/87   Pulse: 113   Resp: 16   Temp: 37 ??C (98.6 ??F)   SpO2: 98%   Weight: 68.9 kg (151 lb 14.4 oz)   PainSc: 4    PainLoc: Head     Physical Exam  Vitals reviewed.   Constitutional:       General: She is not in acute distress.     Appearance: Normal appearance.   HENT:      Head: Normocephalic.      Right Ear: Tympanic membrane, ear canal and external ear normal.      Left Ear: Tympanic membrane, ear canal and external ear normal.      Nose: Nose normal.      Mouth/Throat:      Pharynx: Oropharynx is clear.   Eyes:      Conjunctiva/sclera: Conjunctivae normal.   Cardiovascular:      Rate and Rhythm: Normal rate and regular rhythm.      Heart sounds: Normal heart sounds.   Pulmonary:      Effort: Pulmonary effort is normal. No respiratory distress.      Breath sounds: Normal breath sounds.   Skin:     General: Skin is warm and dry.   Neurological:      Mental Status: She is alert.          No results found.     LABS/X-RAYS/EKG/MEDS:       Results for orders placed or performed in visit on 06/06/23   POCT SARS/Flu/RSV   Result Value Ref Range    POCT SARS-CoV-2 NAA Negative Negative    Influenza A Negative Negative    Influenza B Negative Negative    POC Rapid RSV, NAA Negative Negative    Operator ID Angelita Ingles        No results found.       PHQ-2 Score: 0    PHQ-9 Score:      Screening complete, no depression identified / no further action needed today      Follow-up with PCP      A copy of these instructions have been given to the patient or responsible adult who demonstrated the ability to learn, asked appropriate questions, and verbalized understanding of the plan of care.  There were no barriers to learning identified.    Please take the time to sign up for a MyChart Account. See sign up information in your After Visit Summary. You will be able to view lab results, make appointments, communicate with providers, and much more.

## 2023-06-08 NOTE — Unmapped (Signed)
[facility] Refill Coordination Note    Specialty Medication(s) to be Shipped:   Inflammatory Disorders: Skyrizi    Other medication(s) to be shipped: No additional medications requested for fill at this time     Alexis Rodgers, DOB: 09/27/1980  Phone: [phone] (home)       All above HIPAA information was verified with patient.     Was a Nurse, learning disability used for this call? No    Completed refill call assessment today to schedule patient's medication shipment from the [facility]  [phone]).  All relevant notes have been reviewed.     Specialty medication(s) and dose(s) confirmed: Regimen is correct and unchanged.   Changes to medications: Alexis Rodgers reports no changes at this time.  Changes to insurance: No  New side effects reported not previously addressed with a pharmacist or physician: None reported  Questions for the pharmacist: No    Confirmed patient received a Conservation officer, historic buildings and a Surveyor, mining with first shipment. The patient will receive a drug information handout for each medication shipped and additional FDA Medication Guides as required.       DISEASE/MEDICATION-SPECIFIC INFORMATION        For patients on injectable medications: Patient currently has 0 doses left.  Next injection is scheduled for 1/7.    SPECIALTY MEDICATION ADHERENCE     Medication Adherence    Patient reported X missed doses in the last month: 0  Specialty Medication: risankizumab-rzaa (SKYRIZI) 150 mg/mL Syrg  Patient is on additional specialty medications: No  Informant: patient              Were doses missed due to medication being on hold? No    risankizumab-rzaa (SKYRIZI) 150 mg/mL Syrg : 0 doses of medicine on hand       REFERRAL TO PHARMACIST     Referral to the pharmacist: Not needed      [facility]     Shipping address confirmed in Epic.       Delivery Scheduled: Yes, Expected medication delivery date: 12/27.     Medication will be delivered via UPS to the prescription address in Epic WAM.    [facility]

## 2023-06-08 NOTE — Unmapped (Signed)
Patient indicated in call with technician that she previously has used syringes and prefers this over the pen.    Alexis Rodgers, Alexis Rodgers, BCPS - Clinical Pharmacist   [facility]

## 2023-09-14 NOTE — Unmapped (Signed)
 Due to patient being in grace period, we didn't schedule delivery today but will check back later next week.    Alexis Rodgers reports her psoriasis remains clear and she hasn't needed topical steroids. Her dose is technically due 4/1 but insurance is currently on pause.     [facility] Clinical Assessment & Refill Coordination Note    Alexis Rodgers, DOB: 1980-10-21  Phone: [phone] (home)     All above HIPAA information was verified with patient.     Was a Nurse, learning disability used for this call? No    Specialty Medication(s):   Inflammatory Disorders: Skyrizi     Current Outpatient Medications   Medication Sig Dispense Refill    cetirizine (ZYRTEC) 10 MG tablet Take 1 tablet (10 mg total) by mouth daily as needed for allergies.      clobetasol (TEMOVATE) 0.05 % ointment APPLY 1-2 TIMES A DAY TO AFFECTED AREAS AS NEEDED UNTIL SMOOTH, REPEAT IF NEEDED. AVOID ON FACE AND SKIN FOLDS 60 g 1    norethindrone (MICRONOR) 0.35 mg tablet Take 1 tablet by mouth daily. 84 tablet 3    PARoxetine (PAXIL) 20 MG tablet Take 1/2 tablet (10 mg) by mouth for 7 days then take 1 tablet 20 mg by mouth daily (Patient not taking: Reported on 06/06/2023) 30 tablet 2    risankizumab-rzaa (SKYRIZI) 150 mg/mL PnIj Inject the contents of 1 pen (150 mg total) under the skin every 12 weeks. Maintenance dose. 1 mL 0    risankizumab-rzaa (SKYRIZI) 150 mg/mL Syrg Inject the contents of 1 syringe (150 mg total) under the skin at weeks 0 and 4. Loading dose. 2 mL 0    risankizumab-rzaa (SKYRIZI) 150 mg/mL Syrg Inject the contents of 1 syringe (150 mg) under the skin Every three (3) months. Maintenance dose. 1 mL 3    scopolamine (TRANSDERM-SCOP) 1 mg over 3 days Place 1 patch (1 mg total) on the skin every third day. (Patient not taking: Reported on 06/06/2023) 4 patch 0     No current facility-administered medications for this visit.        Changes to medications: Raziya reports no changes at this time.    Medication list has been reviewed and updated in Epic: Yes    Allergies   Allergen Reactions    Dupixent Pen [Dupilumab]     Codeine      Other reaction(s): nausea and vomiting    Penicillins Other (See Comments)     Other reaction(s): anaphylaxis    Sulfa (Sulfonamide Antibiotics) Other (See Comments)     Other reaction(s): Unknown       Changes to allergies: No    Allergies have been reviewed and updated in Epic: Yes    SPECIALTY MEDICATION ADHERENCE     Skyrizi - 0 left     Specialty medication(s) dose(s) confirmed: Regimen is correct and unchanged.     Are there any concerns with adherence? No    Adherence counseling provided? Not needed    CLINICAL MANAGEMENT AND INTERVENTION      Clinical Benefit Assessment:    Do you feel the medicine is effective or helping your condition? Yes    Clinical Benefit counseling provided? Not needed    Adverse Effects Assessment:    Are you experiencing any side effects? No    Are you experiencing difficulty administering your medicine? No    Quality of Life Assessment:    Quality of Life    Rheumatology  Oncology  Dermatology  Cystic Fibrosis          How many days over the past month did your psoriasis  keep you from your normal activities? For example, brushing your teeth or getting up in the morning. 0    Have you discussed this with your provider? Not needed    Acute Infection Status:    Acute infections noted within Epic:  No active infections    Patient reported infection: None    Therapy Appropriateness:    Is therapy appropriate based on current medication list, adverse reactions, adherence, clinical benefit and progress toward achieving therapeutic goals? Yes, therapy is appropriate and should be continued     Clinical Intervention:    Was an intervention completed as part of this clinical assessment? No    DISEASE/MEDICATION-SPECIFIC INFORMATION      For patients on injectable medications: Patient currently has 0 doses left.  Next injection is scheduled for 4/1 (will be given a bit late due to insurance issue).    Chronic Inflammatory Diseases: Have you experienced any flares in the last month? No  Has this been reported to your provider? No    PATIENT SPECIFIC NEEDS     Does the patient have any physical, cognitive, or cultural barriers? No    Is the patient high risk? No    Does the patient require physician intervention or other additional services (i.e., nutrition, smoking cessation, social work)? No    Does the patient have an additional or emergency contact listed in their chart? Yes    SOCIAL DETERMINANTS OF HEALTH     At the [facility], we have learned that life circumstances - like trouble affording food, housing, utilities, or transportation can affect the health of many of our patients.   That is why we wanted to ask: are you currently experiencing any life circumstances that are negatively impacting your health and/or quality of life? Patient declined to answer    Social Drivers of Health     Food Insecurity: Not on file   Tobacco Use: Medium Risk (06/06/2023)    Patient History     Smoking Tobacco Use: Former     Smokeless Tobacco Use: Never     Passive Exposure: Never   Transportation Needs: Not on file   Alcohol Use: Not on file   Housing: Unknown (05/31/2021)    Housing     Within the past 12 months, have you ever stayed: outside, in a car, in a tent, in an overnight shelter, or temporarily in someone else's home (i.e. couch-surfing)?: No     Are you worried about losing your housing?: Not on file   Physical Activity: Not on file   Utilities: Not on file   Stress: Not on file   Interpersonal Safety: Not on file   Substance Use: Not on file (04/22/2023)   Intimate Partner Violence: Not At Risk (11/22/2021)    Humiliation, Afraid, Rape, and Kick questionnaire     Fear of Current or Ex-Partner: No     Emotionally Abused: No     Physically Abused: No     Sexually Abused: No   Social Connections: Not on file   Financial Resource Strain: Not on file   Depression: Not at risk (06/06/2023)    PHQ-2     PHQ-2 Score: 0   Internet Connectivity: Not on file   Health Literacy: Low Risk  (05/31/2021)    Health Literacy     : Never  Would you be willing to receive help with any of the needs that you have identified today? Not applicable       SHIPPING     Specialty Medication(s) to be Shipped:   Inflammatory Disorders: Skyrizi    Other medication(s) to be shipped: No additional medications requested for fill at this time     Changes to insurance: No    Cost and Payment:  na - unable to determine     Delivery Scheduled: Patient declined refill at this time due to grace period.     Medication will be delivered via  na  to the confirmed  na  address in [facility].    The patient will receive a drug information handout for each medication shipped and additional FDA Medication Guides as required.  Verified that patient has previously received a Conservation officer, historic buildings and a Surveyor, mining.    The patient or caregiver noted above participated in the development of this care plan and knows that they can request review of or adjustments to the care plan at any time.      All of the patient's questions and concerns have been addressed.    Kourosh Jablonsky [facility] Specialty Pharmacist

## 2023-09-24 NOTE — Unmapped (Signed)
 Still get grace period response from insurance on 4/10. Will test again in a few days.    Niklaus Mamaril A. Felipe Horton, [name], BCPS - Clinical Pharmacist   [facility]    [address] [place], [place]  [address]  t [phone], opt 4 then 2 - f [phone]

## 2023-09-29 NOTE — Unmapped (Signed)
 Update 4/15 - [employer] is processing correctly now. Patient reports she's had no flaring.     Scheduled delivery for 4/17 via UPS.    Alandra Sando A. Felipe Horton, [name], BCPS - Clinical Pharmacist   [facility]    [address] [place], [place]  [address]  t [phone], opt 4 then 2 - f [phone]

## 2023-09-30 MED FILL — SKYRIZI 150 MG/ML SUBCUTANEOUS SYRINGE: SUBCUTANEOUS | 84 days supply | Qty: 1 | Fill #1

## 2023-11-04 DIAGNOSIS — F419 Anxiety disorder, unspecified: Principal | ICD-10-CM

## 2023-11-04 MED ORDER — PAROXETINE 20 MG TABLET
ORAL_TABLET | 2 refills | 0.00000 days
Start: 2023-11-04 — End: ?

## 2023-12-17 NOTE — Unmapped (Signed)
 [facility] Special[facility] and Home Delivery Pharmacy Refill Coordination Note    Specialty Medication(s) to be Shipped:   Inflammatory Disorders: Skyrizi     Other medication(s) to be shipped: No additional medications requested for fill at this time     Alexis Rodgers, DOB: [birth year]  Phone: [phone] (home)       All above HIPAA information was verified with patient.     Was a Nurse, learning disability used for this call? No    Completed refill call assessment today to schedule patient's medication shipment from the [facility] and Home Delivery Pharmacy  [phone]).  All relevant notes have been reviewed.     Specialty medication(s) and dose(s) confirmed: Regimen is correct and unchanged.   Changes to medications: Alexis Rodgers reports no changes at this time.  Changes to insurance: No  New side effects reported not previously addressed with a pharmacist or physician: None reported  Questions for the pharmacist: No    Confirmed patient received a Conservation officer, historic buildings and a Surveyor, mining with first shipment. The patient will receive a drug information handout for each medication shipped and additional FDA Medication Guides as required.       DISEASE/MEDICATION-SPECIFIC INFORMATION        For patients on injectable medications: Patient currently has 0 doses left.  Next injection is scheduled for 7/10 - will be able to clear grace period on the 10th, so agreed on shipment for 7/15 to allow time for processing.    SPECIALTY MEDICATION ADHERENCE     Medication Adherence    Patient reported X missed doses in the last month: 0  Specialty Medication: risankizumab -rzaa (SKYRIZI ) 150 mg/mL Syrg              Were doses missed due to medication being on hold? No    risankizumab -rzaa (SKYRIZI ) 150 mg/mL Syrg : 0 doses of medicine on hand       REFERRAL TO PHARMACIST     Referral to the pharmacist: Not needed      SHIPPING     Shipping address confirmed in Epic.       Delivery Scheduled: Yes, Expected medication delivery date: 7/15. Medication will be delivered via UPS to the prescription address in Epic WAM.    Dimonique Bourdeau A [facility]  Specialty Pharmacist

## 2023-12-28 NOTE — Unmapped (Signed)
 Alexis Rodgers [facility] 's Skyrizi  shipment will be delayed as a result of the patient's insurance plan being in grace period (patient needs to pay insurance premium).     I have reached out to the patient  at [phone] and communicated the delay. We will wait for a call back from the patient to reschedule the delivery.  We have not confirmed the new delivery date.

## 2024-01-01 NOTE — Unmapped (Signed)
 Alexis Rodgers 's SKYRIZI  150 mg/mL Syrg (risankizumab -rzaa) shipment will be canceled as a result of the patient's insurance plan being in grace period (patient needs to pay insurance premium).     I have reached out to the patient  at [phone] and communicated the delay. We will not reschedule the medication and have removed this/these medication(s) from the work request.  We have canceled this work request.

## 2024-01-18 NOTE — Unmapped (Signed)
 [facility] and Home Delivery Pharmacy Refill Coordination Note    Specialty Medication(s) to be Shipped:   Inflammatory Disorders: Skyrizi     Other medication(s) to be shipped: No additional medications requested for fill at this time     Alexis Rodgers, DOB: [birth year]  Phone: [phone] (home)       All above HIPAA information was verified with patient.     Was a Nurse, learning disability used for this call? No    Completed refill call assessment today to schedule patient's medication shipment from the [facility] and Home Delivery Pharmacy  [phone]).  All relevant notes have been reviewed.     Specialty medication(s) and dose(s) confirmed: Regimen is correct and unchanged.   Changes to medications: Alexis Rodgers reports no changes at this time.  Changes to insurance: No  New side effects reported not previously addressed with a pharmacist or physician: None reported  Questions for the pharmacist: No    Confirmed patient received a Conservation officer, historic buildings and a Surveyor, mining with first shipment. The patient will receive a drug information handout for each medication shipped and additional FDA Medication Guides as required.       DISEASE/MEDICATION-SPECIFIC INFORMATION        For patients on injectable medications: Patient currently has 0 doses left.  Next injection is scheduled for overdue will take once received.    SPECIALTY MEDICATION ADHERENCE     Medication Adherence    Patient reported X missed doses in the last month: 1-2  Specialty Medication: SKYRIZI  150 mg/mL  Patient is on additional specialty medications: No              Were doses missed due to medication being on hold? No    Skyrizi  150 mg/ml: 0 doses of medicine on hand        REFERRAL TO PHARMACIST     Referral to the pharmacist: Not needed      [facility]     Shipping address confirmed in Epic.     Cost and Payment: Patient has a $0 copay, payment information is not required.    Delivery Scheduled: Yes, Expected medication delivery date: 01/20/24. Medication will be delivered via UPS to the prescription address in Epic WAM.    [name]   [facility] Specialty and Home Delivery Pharmacy  Specialty Technician

## 2024-01-19 MED FILL — SKYRIZI 150 MG/ML SUBCUTANEOUS SYRINGE: SUBCUTANEOUS | 84 days supply | Qty: 1 | Fill #2

## 2024-04-04 DIAGNOSIS — Z1231 Encounter for screening mammogram for malignant neoplasm of breast: Principal | ICD-10-CM

## 2024-04-13 NOTE — Progress Notes (Signed)
 [facility] Specialty and Home Delivery Pharmacy Refill Coordination Note    Specialty Medication(s) to be Shipped:   Inflammatory Disorders: Skyrizi     Other medication(s) to be shipped: No additional medications requested for fill at this time    Specialty Medications not needed at this time: N/A     Alexis Rodgers, DOB: 1981-02-26  Phone: [phone] (home)       All above HIPAA information was verified with patient.     Was a nurse, learning disability used for this call? No    Completed refill call assessment today to schedule patient's medication shipment from the [facility] and Home Delivery Pharmacy  [phone]).  All relevant notes have been reviewed.     Specialty medication(s) and dose(s) confirmed: Regimen is correct and unchanged.   Changes to medications: Alexis Rodgers reports no changes at this time.  Changes to insurance: No  New side effects reported not previously addressed with a pharmacist or physician: None reported  Questions for the pharmacist: No    Confirmed patient received a Conservation Officer, Historic Buildings and a Surveyor, Mining with first shipment. The patient will receive a drug information handout for each medication shipped and additional FDA Medication Guides as required.       DISEASE/MEDICATION-SPECIFIC INFORMATION        For patients on injectable medications: Next injection is scheduled for 10/31.    SPECIALTY MEDICATION ADHERENCE     Medication Adherence    Patient reported X missed doses in the last month: 0  Specialty Medication: Skyrizi  150mg /ml syringe  Patient is on additional specialty medications: No  Patient is on more than two specialty medications: No  Any gaps in refill history greater than 2 weeks in the last 3 months: no  Demonstrates understanding of importance of adherence: yes  Informant: patient  Reliability of informant: reliable  Provider-estimated medication adherence level: good  Patient is at risk for Non-Adherence: No  Reasons for non-adherence: no problems identified  Confirmed plan for next specialty medication refill: delivery by pharmacy  Refills needed for supportive medications: not needed          Refill Coordination    Has the Patients' Contact Information Changed: No  Is the Shipping Address Different: No         Were doses missed due to medication being on hold? No    Skyrizi  150 mg/ml: 0 days of medicine on hand       REFERRAL TO PHARMACIST     Referral to the pharmacist: Not needed      [facility]     Shipping address confirmed in Epic.     Cost and Payment: Patient has a $0 copay, payment information is not required.    Delivery Scheduled: Yes, Expected medication delivery date: 10/31.     Medication will be delivered via UPS to the prescription address in Epic WAM.    [facility]  Specialty Technician

## 2024-04-14 MED FILL — SKYRIZI 150 MG/ML SUBCUTANEOUS SYRINGE: SUBCUTANEOUS | 84 days supply | Qty: 1 | Fill #3

## 2024-04-27 DIAGNOSIS — U071 COVID-19: Principal | ICD-10-CM

## 2024-04-27 MED ORDER — PAXLOVID 300 MG (150 MG X 2)-100 MG TABLETS IN A DOSE PACK
ORAL_TABLET | ORAL | 0 refills | 0.00000 days | Status: CP
Start: 2024-04-27 — End: ?

## 2024-04-27 NOTE — Progress Notes (Signed)
 Name:  Alexis Rodgers  DOB: Dec 31, 1980  Date: 04/27/2024    ASSESSMENT/PLAN:  Alexis Rodgers was seen today for covid-19.    Diagnoses and all orders for this visit:    COVID-19    Other orders  -     benzonatate (TESSALON) 200 MG capsule; Take 1 capsule (200 mg total) by mouth Three (3) times a day as needed.  -     promethazine-dextromethorphan (PROMETHAZINE-DM) 6.25-15 mg/5 mL syrup; Take 5 mL by mouth four (4) times a day as needed.  -     nirmatrelvir-ritonavir (PAXLOVID CO-PACK) 300 mg (150 mg x 2)-100 mg tablet; See package instructions.      Alexis Rodgers is a 43 y.o. female with a PMH of psoriasis taking Skyrizi  who presented to urgent care with fever, chills, body aches and mild URI symptoms over the past 48 hours.  Patient had a positive home COVID test last night.  Vital signs were stable in clinic and lungs were clear on exam.  Patient was prescribed Paxlovid which initially she thought she might not be able to fill but she did find a co-pay coupon on [employer] website that may help with the payments.  I also gave her prescriptions for Tessalon Perles and promethazine DM syrup is a written prescription which she may fill if the cough worsens.      Patient is to rest and drink plenty of fluids and may take Tylenol/ibuprofen as needed for headache, body aches and/or fever.   Over-the-counter cough and sore throat medications may be taken as needed also.  Patient is to follow-up with their PCP if symptoms are not improving.  Appropriate isolation measures were discussed.  Patient is to go to the emergency department for increasing shortness of breath, severe chest or abdominal pain, rapid heartbeat, severe headache, persistent high fever or vomiting or for any other worsening symptoms.  Preprinted instructions regarding COVID were provided.  Patient expressed understanding of instructions prior to discharge and was discharged home in good condition. ------------------------------------------------------------------------------    Chief Complaint   Patient presents with    Covid-19     Fever, Cough, Congestion, Body aches X 2-3 days, positive covid test last night        HPI: Alexis Rodgers is a 43 y.o. female with a past medical history of psoriasis who takes Skyrizi  who presents to urgent care with complaints of fever, chills and bodyaches for the past 48 hours.  Patient has been feeling very fatigued and has a mild cough, sore throat and congestion.  Patient did a home COVID test last night which was positive.  She denies current headache.  She has had some nausea but no active vomiting.  She has noted some diarrhea.  No complaints of chest pain, shortness of breath or abdominal pain.  She has been taking some over-the-counter Tylenol and Advil for her symptoms.    ROS:  Review of systems as above.  Rest of review of systems negative unless otherwise noted as per HPI.    I have reviewed past medical, surgical, medications, allergies, social and family histories today and updated them in Epic where appropriate.    PMH:  Past Medical History[1]    SURGICAL HX:  Past Surgical History[2]    MEDS:  Current Medications[3]    ALL:  Allergies[4]    SH:  Short Social History[5]    FH:  Family History[6]    VITALS:  Vitals:    04/27/24 1137  BP: 139/88   Pulse: 90   Resp: 16   Temp: 36.8 ??C (98.2 ??F)   SpO2: 98%     Body mass index is 28.08 kg/m??.    Physical Exam  Vitals and nursing note reviewed.   Constitutional:       General: She is not in acute distress.     Appearance: Normal appearance. She is not ill-appearing.   HENT:      Head: Normocephalic and atraumatic.      Mouth/Throat:      Mouth: Mucous membranes are moist.      Pharynx: Oropharynx is clear. No oropharyngeal exudate or posterior oropharyngeal erythema.   Eyes:      Conjunctiva/sclera: Conjunctivae normal.      Pupils: Pupils are equal, round, and reactive to light.   Cardiovascular:      Rate and Rhythm: Normal rate and regular rhythm.      Pulses: Normal pulses.      Heart sounds: Normal heart sounds. No murmur heard.     No friction rub. No gallop.   Pulmonary:      Effort: Pulmonary effort is normal. No respiratory distress.      Breath sounds: Normal breath sounds. No stridor. No wheezing, rhonchi or rales.   Abdominal:      Palpations: Abdomen is soft.      Tenderness: There is no abdominal tenderness.   Musculoskeletal:         General: No swelling. Normal range of motion.      Cervical back: Neck supple.   Lymphadenopathy:      Cervical: No cervical adenopathy.   Skin:     General: Skin is warm and dry.      Findings: No rash.   Neurological:      General: No focal deficit present.      Mental Status: She is alert and oriented to person, place, and time.   Psychiatric:         Mood and Affect: Mood normal.         Behavior: Behavior normal.       TEST  RESULTS:    No results found for this visit on 04/27/24.    PATIENT DISPOSITION:    Follow-up with PCP       Alexis Rodgers L. Nicholaus, MD  [facility] [place]  ----------------------------------------------------------------  Note - This record has been created using [employer]. Chart creation errors have been sought, but may not always have been located. Such creation errors do not reflect on the standard of medical care.       [1]   Past Medical History:  Diagnosis Date    Initiation of oral contraception 12/05/2021    12/05/21 Rx'd mini pill       Kidney stone    [2]   Past Surgical History:  Procedure Laterality Date    SKIN BIOPSY     [3]   Current Outpatient Medications:     cetirizine (ZYRTEC) 10 MG tablet, Take 1 tablet (10 mg total) by mouth daily as needed for allergies., Disp: , Rfl:     clobetasol  (TEMOVATE ) 0.05 % ointment, APPLY 1-2 TIMES A DAY TO AFFECTED AREAS AS NEEDED UNTIL SMOOTH, REPEAT IF NEEDED. AVOID ON FACE AND SKIN FOLDS, Disp: 60 g, Rfl: 1    risankizumab -rzaa (SKYRIZI ) 150 mg/mL Syrg, Inject the contents of 1 syringe (150 mg total) under the skin at weeks 0 and 4. Loading dose., Disp: 2 mL, Rfl:  0    risankizumab -rzaa (SKYRIZI ) 150 mg/mL Syrg, Inject the contents of 1 syringe (150 mg) under the skin Every three (3) months. Maintenance dose., Disp: 1 mL, Rfl: 3    scopolamine  (TRANSDERM-SCOP) 1 mg over 3 days, Place 1 patch (1 mg total) on the skin every third day., Disp: 4 patch, Rfl: 0    benzonatate (TESSALON) 200 MG capsule, Take 1 capsule (200 mg total) by mouth Three (3) times a day as needed., Disp: 30 capsule, Rfl: 0    nirmatrelvir-ritonavir (PAXLOVID CO-PACK) 300 mg (150 mg x 2)-100 mg tablet, See package instructions., Disp: 30 tablet, Rfl: 0    norethindrone  (MICRONOR ) 0.35 mg tablet, Take 1 tablet by mouth daily., Disp: 84 tablet, Rfl: 3    PARoxetine  (PAXIL ) 20 MG tablet, Take 1/2 tablet (10 mg) by mouth for 7 days then take 1 tablet 20 mg by mouth daily (Patient not taking: Reported on 04/27/2024), Disp: 30 tablet, Rfl: 2    promethazine-dextromethorphan (PROMETHAZINE-DM) 6.25-15 mg/5 mL syrup, Take 5 mL by mouth four (4) times a day as needed., Disp: 180 mL, Rfl: 0    risankizumab -rzaa (SKYRIZI ) 150 mg/mL PnIj, Inject the contents of 1 pen (150 mg total) under the skin every 12 weeks. Maintenance dose., Disp: 1 mL, Rfl: 0  [4]   Allergies  Allergen Reactions    Dupixent Pen [Dupilumab]     Codeine      Other reaction(s): nausea and vomiting    Penicillins Other (See Comments)     Other reaction(s): anaphylaxis    Sulfa (Sulfonamide Antibiotics) Other (See Comments)     Other reaction(s): Unknown   [5]   Social History  Tobacco Use    Smoking status: Former     Current packs/day: 0.00     Types: Cigarettes     Quit date: 01/15/2015     Years since quitting: 9.2     Passive exposure: Never    Smokeless tobacco: Never   Vaping Use    Vaping status: Never Used   Substance Use Topics    Alcohol use: Yes     Alcohol/week: 0.0 standard drinks of alcohol     Comment: occ    Drug use: No   [6] Family History  Problem Relation Age of Onset    Hypertension Mother     Heart attack Father     Breast cancer Maternal Grandmother     Cancer Other         bladder    Nephrolithiasis Other     Melanoma Neg Hx     Basal cell carcinoma Neg Hx     Squamous cell carcinoma Neg Hx     GU problems Neg Hx     Kidney cancer Neg Hx     Prostate cancer Neg Hx     Ovarian cancer Neg Hx     Endometrial cancer Neg Hx     Colon cancer Neg Hx

## 2024-04-27 NOTE — Patient Instructions (Signed)
 Rest and drink plenty of fluids.  Tylenol/ibuprofen as needed for fever, headache and body aches.  Other over-the-counter cough medications or sore throat medications are fine as well.    You may fill the prescriptions for the cough medicines if you find that the cough is worsening over the next few days.    Since the COVID is positive, you will need to isolate until you have no fever for 24 hours AND your symptoms have significantly improved.  You should wear a mask for approximately 5 days after that.    Follow-up with your PCP if your symptoms are not improving.    Go to the emergency department for increasing shortness of breath, severe chest or abdominal pain, rapid heartbeat. severe headache, persistent high fever or vomiting or for any other worsening symptoms.

## 2024-05-30 DIAGNOSIS — L409 Psoriasis, unspecified: Principal | ICD-10-CM

## 2024-05-30 MED ORDER — SKYRIZI 150 MG/ML SUBCUTANEOUS SYRINGE
SUBCUTANEOUS | 3 refills | 84.00000 days
Start: 2024-05-30 — End: ?

## 2024-05-30 NOTE — Telephone Encounter (Signed)
 Called, left voicemail    Thanks  [place]

## 2024-05-30 NOTE — Telephone Encounter (Signed)
 Correction: was unable to leave a Oncologist message sent    Thanks  [place]

## 2024-06-07 MED ORDER — SKYRIZI 150 MG/ML SUBCUTANEOUS SYRINGE
SUBCUTANEOUS | 3 refills | 84.00000 days
Start: 2024-06-07 — End: ?

## 2024-06-07 NOTE — Telephone Encounter (Signed)
 Needs appointment

## 2024-07-05 ENCOUNTER — Encounter: Admit: 2024-07-05 | Discharge: 2024-07-05 | Payer: BLUE CROSS/BLUE SHIELD

## 2024-07-05 DIAGNOSIS — F419 Anxiety disorder, unspecified: Principal | ICD-10-CM

## 2024-07-05 DIAGNOSIS — Z124 Encounter for screening for malignant neoplasm of cervix: Principal | ICD-10-CM

## 2024-07-05 DIAGNOSIS — Z7689 Persons encountering health services in other specified circumstances: Principal | ICD-10-CM

## 2024-07-05 DIAGNOSIS — R030 Elevated blood-pressure reading, without diagnosis of hypertension: Principal | ICD-10-CM

## 2024-07-05 DIAGNOSIS — Z01419 Encounter for gynecological examination (general) (routine) without abnormal findings: Principal | ICD-10-CM

## 2024-07-05 MED ORDER — BUSPIRONE 5 MG TABLET
ORAL_TABLET | Freq: Three times a day (TID) | ORAL | 1 refills | 30.00000 days | Status: CP
Start: 2024-07-05 — End: ?

## 2024-07-05 NOTE — Assessment & Plan Note (Addendum)
 Pap done  STI screening offered and declined  Contraception: none  Breast Cancer Screening: Mammogram scheduled 07/19/24  Colon cancer Screening: needs; referral to PCP placed today  GYN complaints: some night sweats, anxiety, hemorrhoids  Preventative health/labs: needs to establish Primary care

## 2024-07-05 NOTE — Assessment & Plan Note (Signed)
 BP elevated today in office even on repeat   Family hx of HTN and Cardiac Disease  Counseled to check BP's at home and keep a log   Will refer to Primary Care to establish care and monitor for any development of HTN

## 2024-07-05 NOTE — Assessment & Plan Note (Addendum)
 Reports took Paxil  prescribed in 2024 but felt on edge with it  Discussed SSRIs for anxiety. Pt reports hx of taking lexapro with GI upset  Offered Zoloft. Reviewed implications, possible side effects. Defers today  Offered Buspar  for GAD. Pt amenable to trialing. Prescribed buspar  5 mg PO up to TID.

## 2024-07-05 NOTE — Progress Notes (Signed)
 Provider: Sharene JONETTA Ruth, CNM  Division: [facility]    Outpatient Gynecology Note: Annual Visit    Assessment/Plan:      Alexis Rodgers is a 44 y.o. female G9P2000 with normal well-woman gynecologic exam.     Problem List Items Addressed This Visit          Other    Well woman exam with routine gynecological exam - Primary    Pap done  STI screening offered and declined  Contraception: none  Breast Cancer Screening: Mammogram scheduled 07/19/24  Colon cancer Screening: needs; referral to PCP placed today  GYN complaints: some night sweats, anxiety, hemorrhoids  Preventative health/labs: needs to establish Primary care          Relevant Orders    Pap Test    Elevated blood pressure reading without diagnosis of hypertension    BP elevated today in office even on repeat   Family hx of HTN and Cardiac Disease  Counseled to check BP's at home and keep a log   Will refer to Primary Care to establish care and monitor for any development of HTN         Anxiety    Reports took Paxil  prescribed in 2024 but felt on edge with it  Discussed SSRIs for anxiety. Pt reports hx of taking lexapro with GI upset  Offered Zoloft. Reviewed implications, possible side effects. Defers today  Offered Buspar  for GAD. Pt amenable to trialing. Prescribed buspar  5 mg PO up to TID.           Relevant Medications    buspirone  (BUSPAR ) 5 MG tablet     Other Visit Diagnoses         Encounter to establish care        Relevant Orders    Ambulatory referral to Internal Medicine      Cervical cancer screening [Z12.4]        Relevant Orders    Pap Test            > Pap smear: Pap w/ HPV co-testing obtained  > Wet prep: n/a  > Mammography: yearly beginning at age 64  > Labs: Pap  > Reviewed health maintenance including  diet and nutrition, vitamins, supplements, and herbals, adequate intake of calcium and vitamin D, regular exercise, adequate sleep, seatbelt use, contraception, BSE, need for mammogram, mammagraphy screening/results, and pap smear screening/results.    Current Rx[1]    Return in about 1 year (around 07/05/2025) for Annual physical or sooner.       Subjective:      Alexis Rodgers is a 44 y.o. female G2P2000 who presents for annual exam. Patient's last menstrual period was 07/04/2024 (exact date). Periods are regular every 28-30 days and last 3-4 days, moderate in flow, no cramping/pain. Patient reports she wants Pap smear yearly although has been counseled on ASCCP guidelines that pap smear w/ HPV co-testing is due every 5 years if normal results. Patient reports she is not using any contraception, no longer in relationship, denies needed contraception at this time. Patient also reports having hemorrhoids that flares up from time to time and uses preparation H for management with good effectiveness. Patient reports she had seen GI in the pats for hemorrhoids with recommendation to not pursue with any surgical removal of hemorrhoids. Patient complaints of some night sweats with waking up in middle of night, feelings of anxiety, and weight gain. Patient reports having tried medication to help with anxiety in the past  without effectiveness. Patient denies any further gyn concerns today.     Health Maintenance  > Contraceptive methods: None.  > Regular self breast exam: yes  > History of abnormal mammogram: no  > Domestic Violence History: no  > Exercise: none  > Dietary Supplements: multivitamin  > Seat Belt Use: yes      Current Medications[2]  Allergies[3]    Past Medical History[4]  Past Surgical History[5]  OB History       Gravida   2    Para   2    Term   2    Preterm        AB        Living             SAB        IAB        Ectopic        Molar        Multiple        Live Births                  Social History     Tobacco Use    Smoking status: Former     Current packs/day: 0.00     Types: Cigarettes     Quit date: 01/15/2015     Years since quitting: 9.4     Passive exposure: Never    Smokeless tobacco: Never   Substance Use Topics Alcohol use: Yes     Alcohol/week: 0.0 standard drinks of alcohol     Comment: occ     Social History     Substance and Sexual Activity   Sexual Activity Not Currently       Immunization History   Administered Date(s) Administered    INFLUENZA TIV (TRI) 41MO+ W/ PRESERV (IM) 05/05/2006    Influenza LAIV (Nasal-Tri) HISTORICAL 04/18/2005         Review Of Systems   A comprehensive review of 14 systems was negative except for pertinent positives noted in HPI.       Objective:      BP 132/107  - Pulse 94  - Wt 73.4 kg (161 lb 14.4 oz)  - LMP 07/04/2024 (Exact Date)  - BMI 28.68 kg/m??     Constitutional: Well-developed, well-nourished female in no acute distress  Neurological: Alert and oriented to person, place, and time  Psychiatric: Mood and affect appropriate  Skin: No rashes or lesions  Neck: Supple without masses. Trachea is midline.Thyroid is normal size without masses  Lymphatics: No cervical, axillary, supraclavicular, or inguinal adenopathy noted  Respiratory: Clear to auscultation bilaterally. Good air movement with normal work of breathing.  Cardiovascular: Regular rate and rhythm. Extremities grossly normal, nontender with no edema; pulses regular  Gastrointestinal: Soft, nontender, nondistended. No masses or hernias appreciated. No hepatosplenomegaly. No fluid wave. No rebound or guarding.  Breast Exam: normal appearance, no masses or tenderness, No nipple retraction or dimpling, No nipple discharge or bleeding, No axillary or supraclavicular adenopathy  Genitourinary:          External Genitalia: Normal female genitalia     Urethra: Midline, no masses     Bladder: Well-suspended, NT     Vagina: smooth, no lesions.     Cervix: No lesions, normal size and consistency; no cervical motion tenderness, moderate bleeding from menses, pap smear obtained     Uterus: Normal size and contour; smooth, mobile, NT, anteverted.  Adnexae: Non-palpable and non-tender  Perineum/Anus: No  lesions           Prior to the outpatient exam, consent was obtained from the patient prior to the sensitive portion of the exam (if performed) including, but not limited to, breast and pelvic examination. A chaperone was offered and present. A student was not involved.         [1]   Current Outpatient Medications   Medication Sig Dispense Refill    cetirizine (ZYRTEC) 10 MG tablet Take 1 tablet (10 mg total) by mouth daily as needed for allergies.      clobetasol  (TEMOVATE ) 0.05 % ointment APPLY 1-2 TIMES A DAY TO AFFECTED AREAS AS NEEDED UNTIL SMOOTH, REPEAT IF NEEDED. AVOID ON FACE AND SKIN FOLDS 60 g 1    promethazine-dextromethorphan (PROMETHAZINE-DM) 6.25-15 mg/5 mL syrup Take 5 mL by mouth four (4) times a day as needed. 180 mL 0    risankizumab -rzaa (SKYRIZI ) 150 mg/mL Syrg Inject the contents of 1 syringe (150 mg total) under the skin at weeks 0 and 4. Loading dose. 2 mL 0    risankizumab -rzaa (SKYRIZI ) 150 mg/mL Syrg Inject the contents of 1 syringe (150 mg) under the skin Every three (3) months. Maintenance dose. 1 mL 3    benzonatate (TESSALON) 200 MG capsule Take 1 capsule (200 mg total) by mouth Three (3) times a day as needed. (Patient not taking: Reported on 07/05/2024) 30 capsule 0    buspirone  (BUSPAR ) 5 MG tablet Take 1 tablet (5 mg total) by mouth Three (3) times a day. 90 tablet 1     No current facility-administered medications for this visit.   [2]   Current Outpatient Medications   Medication Sig Dispense Refill    cetirizine (ZYRTEC) 10 MG tablet Take 1 tablet (10 mg total) by mouth daily as needed for allergies.      clobetasol  (TEMOVATE ) 0.05 % ointment APPLY 1-2 TIMES A DAY TO AFFECTED AREAS AS NEEDED UNTIL SMOOTH, REPEAT IF NEEDED. AVOID ON FACE AND SKIN FOLDS 60 g 1    promethazine-dextromethorphan (PROMETHAZINE-DM) 6.25-15 mg/5 mL syrup Take 5 mL by mouth four (4) times a day as needed. 180 mL 0    risankizumab -rzaa (SKYRIZI ) 150 mg/mL Syrg Inject the contents of 1 syringe (150 mg total) under the skin at weeks 0 and 4. Loading dose. 2 mL 0    risankizumab -rzaa (SKYRIZI ) 150 mg/mL Syrg Inject the contents of 1 syringe (150 mg) under the skin Every three (3) months. Maintenance dose. 1 mL 3    benzonatate (TESSALON) 200 MG capsule Take 1 capsule (200 mg total) by mouth Three (3) times a day as needed. (Patient not taking: Reported on 07/05/2024) 30 capsule 0    buspirone  (BUSPAR ) 5 MG tablet Take 1 tablet (5 mg total) by mouth Three (3) times a day. 90 tablet 1     No current facility-administered medications for this visit.   [3]   Allergies  Allergen Reactions    Dupixent Pen [Dupilumab]     Codeine      Other reaction(s): nausea and vomiting    Penicillins Other (See Comments)     Other reaction(s): anaphylaxis    Sulfa (Sulfonamide Antibiotics) Other (See Comments)     Other reaction(s): Unknown   [4]   Past Medical History:  Diagnosis Date    Initiation of oral contraception 12/05/2021    12/05/21 Rx'd mini pill       Kidney stone    [5]   Past Surgical History:  Procedure Laterality  Date    SKIN BIOPSY

## 2024-07-08 DIAGNOSIS — L409 Psoriasis, unspecified: Principal | ICD-10-CM

## 2024-07-08 MED ORDER — SKYRIZI 150 MG/ML SUBCUTANEOUS SYRINGE
SUBCUTANEOUS | 3 refills | 84.00000 days
Start: 2024-07-08 — End: ?

## 2024-07-12 ENCOUNTER — Ambulatory Visit: Admit: 2024-07-12 | Discharge: 2024-07-13 | Payer: BLUE CROSS/BLUE SHIELD

## 2024-07-12 DIAGNOSIS — L409 Psoriasis, unspecified: Secondary | ICD-10-CM

## 2024-07-12 DIAGNOSIS — Z79899 Other long term (current) drug therapy: Principal | ICD-10-CM

## 2024-07-12 MED ORDER — CLOBETASOL 0.05 % TOPICAL OINTMENT
1 refills | 0.00000 days | Status: CN
Start: 2024-07-12 — End: ?

## 2024-07-12 MED ORDER — SKYRIZI 150 MG/ML SUBCUTANEOUS SYRINGE
SUBCUTANEOUS | 3 refills | 84.00000 days | Status: CP
Start: 2024-07-12 — End: ?
  Filled 2024-07-20: qty 1, 84d supply, fill #0

## 2024-07-12 NOTE — Progress Notes (Signed)
 Dermatology Note       Assessment and Plan:        Benign Lesions/ Findings:   Actinic Elastosis  Lentigo/Lentigines  Nevus/Nevi-Benign Appearing  - Reassurance provided regarding the benign appearance of lesions noted on exam today; no treatment is indicated in the absence of symptoms/changes.  - Reinforced importance of photoprotective strategies including liberal and frequent sunscreen use of a broad-spectrum SPF 30 or greater, use of protective clothing, and sun avoidance for prevention of cutaneous malignancy and photoaging.  Counseled patient on the importance of regular self-skin monitoring as well as routine clinical skin examinations as scheduled.     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 No side effects from current treatment regimen.  - Continue Risankizumab -rzaa (Skyrizi ) 150 mg subcutaneous every three months.  - Ordered TB test  - Refilled clobetasol  0.05% ointment for as-needed use.        The patient was advised to call for an appointment should any new, changing, or symptomatic lesions develop.     RTC: No follow-ups on file. or sooner as needed   _________________________________________________________________      Chief Complaint     Chief Complaint   Patient presents with    Skin Check     Fbse     Psoriasis     Doing great no spots to report today       HPI     Alexis Rodgers is a 44 y.o. female who presents for:     The patient denies any other new or changing lesions or areas of concern.       History of Present Illness  Alexis Rodgers is a 44 year old female who presents for a full body skin check and medication refill.    Psoriasis  - No active psoriatic lesions for over one year.  - Receives Skyrizi  injection every three months; next dose due this week.  - No adverse effects from Skyrizi .  - Previously used topical clobetasol  as needed, but has not required it recently.  - Has leftover clobetasol  from prior prescriptions.    Skin cancer surveillance  - No full body skin examination since previous visits.  - Requests comprehensive skin evaluation at this visit.    Skin care routine  - Applies sunscreen regularly, especially during summer.  - Uses facial sunscreen daily, including in winter.  - Considering lighter sunscreen options for body use due to heaviness of current product.      Pertinent Past Medical History       Problem List          Musculoskeletal and Integument    Psoriasis           Past Medical History, Family History, Social History, Medication List, Allergies, and Problem List were reviewed in the rooming section of Epic.     ROS: Other than symptoms mentioned in the HPI, no fevers, chills, or other skin complaints    Physical Examination     GENERAL: Well-appearing female in no acute distress, resting comfortably.  NEURO: Alert and oriented, answers questions appropriately  PSYCH: Normal mood and affect  SKIN (Full Skin Exam): Examination of the face, eyelids, lips, nose, ears, neck, chest, abdomen, back, arms, legs, hands, feet, palms, soles, nails was performed  - Actinic Elastosis: mild chronic sun damage: dyspigmentation, telangiectasia, and wrinkling  - Lentigo/lentigines: Scattered pigmented macules that are  tan to brown in color and are somewhat non-uniform in shape and concentrated in the sun-exposed areas of the face and upper extremities  - Nevus/nevi: Scattered well-demarcated, regular, pigmented macule(s) and/or papule(s) on the scattered diffusely      All areas not commented on are within normal limits or unremarkable      (Approved Template 02/27/2020)

## 2024-07-12 NOTE — Patient Instructions (Addendum)
 Meet your team:     Your intake nurse is: Britt Boozer    Please remember to fill out the survey you will receive after your visit. Your comments help [place] continue to improve our care.      Thanks in advance!      [facility] Dermatology Clinical Staff

## 2024-07-13 DIAGNOSIS — L409 Psoriasis, unspecified: Principal | ICD-10-CM

## 2024-07-13 LAB — QUANTIFERON TB GOLD PLUS
QUANTIFERON ANTIGEN 1 MINUS NIL: 0.01 [IU]/mL
QUANTIFERON ANTIGEN 2 MINUS NIL: 0.01 [IU]/mL
QUANTIFERON MITOGEN: 9.96 [IU]/mL
QUANTIFERON TB GOLD PLUS: NEGATIVE
QUANTIFERON TB NIL VALUE: 0.04 [IU]/mL

## 2024-07-13 LAB — TB AG2: TB AG2 VALUE: 0.05

## 2024-07-13 LAB — TB NIL: TB NIL VALUE: 0.04

## 2024-07-13 LAB — TB AG1: TB AG1 VALUE: 0.05

## 2024-07-13 LAB — TB MITOGEN: TB MITOGEN VALUE: 10

## 2024-07-19 ENCOUNTER — Inpatient Hospital Stay: Admit: 2024-07-19 | Discharge: 2024-07-19 | Payer: BLUE CROSS/BLUE SHIELD

## 2024-07-19 NOTE — Progress Notes (Signed)
 Alexis Rodgers continues to do well on Skyrizi  - she's had no flaring.     [facility] Clinical Assessment & Refill Coordination Note    Alexis Rodgers, DOB: 02-10-1981  Phone: [phone] (home)     All above HIPAA information was verified with patient.     Was a nurse, learning disability used for this call? No    Specialty Medication(s):   Inflammatory Disorders: Skyrizi      Current Medications[1]     Changes to medications: Alexis Rodgers reports no changes at this time.    Medication list has been reviewed and updated in Epic: Yes    Allergies[2]    Changes to allergies: No    Allergies have been reviewed and updated in Epic: Yes    SPECIALTY MEDICATION ADHERENCE     Skyrizi  150 mg: 0 doses of medicine on hand     Specialty medication is an injection or given on a cycle: Yes, Next injection is scheduled for asap (was due ~1/23) - needed appt for refills, then PA renewal.    Medication Adherence    Patient reported X missed doses in the last month: 0  Specialty Medication: Skyrizi           Specialty medication(s) dose(s) confirmed: Regimen is correct and unchanged.     Are there any concerns with adherence? No    Adherence counseling provided? Not needed    CLINICAL MANAGEMENT AND INTERVENTION      Clinical Benefit Assessment:    Do you feel the medicine is effective or helping your condition? Yes    Clinical Benefit counseling provided? Not needed    Adverse Effects Assessment:    Are you experiencing any side effects? No    Are you experiencing difficulty administering your medicine? No    Quality of Life Assessment:    Quality of Life    Rheumatology  Oncology  Dermatology  1. What impact has your specialty medication had on the symptoms of your skin condition (i.e. itchiness, soreness, stinging)?: Tremendous  2. What impact has your specialty medication had on your comfort level with your skin?: Tremendous  Cystic Fibrosis          How many days over the past month did your psoriasis  keep you from your normal activities? For example, brushing your teeth or getting up in the morning. Patient declined to answer    Have you discussed this with your provider? Not needed    Acute Infection Status:    Acute infections noted within Epic:  No active infections    Patient reported infection: None    Therapy Appropriateness:    Is the medication and dose appropriate considering the patient???s diagnosis, treatment, and disease journey, comorbidities, medical history, current medications, allergies, therapeutic goals, self-administration ability, and access barriers? Yes, therapy is appropriate and should be continued     Clinical Intervention:    Was an intervention completed as part of this clinical assessment? No    DISEASE/MEDICATION-SPECIFIC INFORMATION      N/A    Chronic Inflammatory Diseases: Have you experienced any flares in the last month? No  Has this been reported to your provider? No    PATIENT SPECIFIC NEEDS     Does the patient have any physical, cognitive, or cultural barriers? No    Is the patient high risk? No    Does the patient require physician intervention or other additional services (i.e., nutrition, smoking cessation, social work)? No    Does the  patient have an additional or emergency contact listed in their chart? Yes    SOCIAL DETERMINANTS OF HEALTH     At the [facility], we have learned that life circumstances - like trouble affording food, housing, utilities, or transportation can affect the health of many of our patients.   That is why we wanted to ask: are you currently experiencing any life circumstances that are negatively impacting your health and/or quality of life? Patient declined to answer    Social Drivers of Health     Food Insecurity: Not on file   Tobacco Use: Medium Risk (07/12/2024)    Patient History     Smoking Tobacco Use: Former     Smokeless Tobacco Use: Never     Passive Exposure: Never   Transportation Needs: Not on file   Alcohol Use: Not on file   Housing: Not on file Physical Activity: Not on file   Utilities: Not on file   Stress: Not on file   Interpersonal Safety: Not on file   Substance Use: Not on file (04/22/2023)   Intimate Partner Violence: Not At Risk (11/22/2021)    Humiliation, Afraid, Rape, and Kick questionnaire     Fear of Current or Ex-Partner: No     Emotionally Abused: No     Physically Abused: No     Sexually Abused: No   Social Connections: Not on file   Financial Resource Strain: Not on file   Health Literacy: Not on file   Internet Connectivity: Not on file       Would you be willing to receive help with any of the needs that you have identified today? Not applicable       SHIPPING     Specialty Medication(s) to be Shipped:   Inflammatory Disorders: Skyrizi     Other medication(s) to be shipped: No additional medications requested for fill at this time    Specialty Medications not needed at this time: N/A     Changes to insurance: No    Cost and Payment: Patient has a $0 copay, payment information is not required.    Delivery Scheduled: Yes, Expected medication delivery date: 2/5.     Medication will be delivered via UPS to the confirmed prescription address in [facility].    The patient will receive a drug information handout for each medication shipped and additional FDA Medication Guides as required.  Verified that patient has previously received a Conservation Officer, Historic Buildings and a Surveyor, Mining.    The patient or caregiver noted above participated in the development of this care plan and knows that they can request review of or adjustments to the care plan at any time.      All of the patient's questions and concerns have been addressed.    Pascal Stiggers [facility] Specialty Pharmacist         [1]   Current Outpatient Medications   Medication Sig Dispense Refill    buspirone  (BUSPAR ) 5 MG tablet Take 1 tablet (5 mg total) by mouth Three (3) times a day. 90 tablet 1    cetirizine (ZYRTEC) 10 MG tablet Take 1 tablet (10 mg total) by mouth daily as needed for allergies.      clobetasol  (TEMOVATE ) 0.05 % ointment APPLY 1-2 TIMES A DAY TO AFFECTED AREAS AS NEEDED UNTIL SMOOTH, REPEAT IF NEEDED. AVOID ON FACE AND SKIN FOLDS 60 g 1    promethazine-dextromethorphan (PROMETHAZINE-DM) 6.25-15 mg/5 mL  syrup Take 5 mL by mouth four (4) times a day as needed. 180 mL 0    risankizumab -rzaa (SKYRIZI ) 150 mg/mL Syrg Inject the contents of 1 syringe (150 mg total) under the skin at weeks 0 and 4. Loading dose. 2 mL 0    risankizumab -rzaa (SKYRIZI ) 150 mg/mL Syrg Inject the contents of 1 syringe (150 mg) under the skin Every three (3) months. Maintenance dose. 1 mL 3     No current facility-administered medications for this visit.   [2]   Allergies  Allergen Reactions    Dupixent Pen [Dupilumab]     Codeine      Other reaction(s): nausea and vomiting    Penicillins Other (See Comments)     Other reaction(s): anaphylaxis    Sulfa (Sulfonamide Antibiotics) Other (See Comments)     Other reaction(s): Unknown
